# Patient Record
Sex: Male | Born: 1955 | Race: Black or African American | Hispanic: No | Marital: Married | Smoking: Current every day smoker
Health system: Southern US, Community
[De-identification: ages and names within clinical notes are randomized; demographics above are authoritative.]

## PROBLEM LIST (undated history)

## (undated) DIAGNOSIS — C801 Malignant (primary) neoplasm, unspecified: Secondary | ICD-10-CM

## (undated) DIAGNOSIS — E7800 Pure hypercholesterolemia, unspecified: Secondary | ICD-10-CM

## (undated) DIAGNOSIS — I509 Heart failure, unspecified: Secondary | ICD-10-CM

## (undated) DIAGNOSIS — I10 Essential (primary) hypertension: Secondary | ICD-10-CM

## (undated) DIAGNOSIS — E119 Type 2 diabetes mellitus without complications: Secondary | ICD-10-CM

## (undated) DIAGNOSIS — G629 Polyneuropathy, unspecified: Secondary | ICD-10-CM

## (undated) DIAGNOSIS — J449 Chronic obstructive pulmonary disease, unspecified: Secondary | ICD-10-CM

---

## 2015-08-30 ENCOUNTER — Inpatient Hospital Stay (HOSPITAL_COMMUNITY)
Admission: EM | Admit: 2015-08-30 | Discharge: 2015-09-05 | DRG: 291 | Disposition: A | Discharge status: Home / Self Care | Payer: Medicare Other | Attending: Internal Medicine | Admitting: Internal Medicine

## 2015-08-30 ENCOUNTER — Emergency Department (HOSPITAL_COMMUNITY): Payer: Medicare Other

## 2015-08-30 ENCOUNTER — Encounter (HOSPITAL_COMMUNITY): Payer: Self-pay | Admitting: Emergency Medicine

## 2015-08-30 DIAGNOSIS — F17200 Nicotine dependence, unspecified, uncomplicated: Secondary | ICD-10-CM | POA: Diagnosis present

## 2015-08-30 DIAGNOSIS — Z7984 Long term (current) use of oral hypoglycemic drugs: Secondary | ICD-10-CM

## 2015-08-30 DIAGNOSIS — E876 Hypokalemia: Secondary | ICD-10-CM

## 2015-08-30 DIAGNOSIS — D649 Anemia, unspecified: Secondary | ICD-10-CM | POA: Diagnosis present

## 2015-08-30 DIAGNOSIS — R0602 Shortness of breath: Secondary | ICD-10-CM | POA: Diagnosis not present

## 2015-08-30 DIAGNOSIS — I110 Hypertensive heart disease with heart failure: Secondary | ICD-10-CM | POA: Diagnosis present

## 2015-08-30 DIAGNOSIS — J9601 Acute respiratory failure with hypoxia: Secondary | ICD-10-CM | POA: Diagnosis present

## 2015-08-30 DIAGNOSIS — C229 Malignant neoplasm of liver, not specified as primary or secondary: Secondary | ICD-10-CM

## 2015-08-30 DIAGNOSIS — E7800 Pure hypercholesterolemia, unspecified: Secondary | ICD-10-CM | POA: Diagnosis present

## 2015-08-30 DIAGNOSIS — J449 Chronic obstructive pulmonary disease, unspecified: Secondary | ICD-10-CM | POA: Diagnosis present

## 2015-08-30 DIAGNOSIS — Z23 Encounter for immunization: Secondary | ICD-10-CM

## 2015-08-30 DIAGNOSIS — I5041 Acute combined systolic (congestive) and diastolic (congestive) heart failure: Secondary | ICD-10-CM | POA: Diagnosis not present

## 2015-08-30 DIAGNOSIS — I10 Essential (primary) hypertension: Secondary | ICD-10-CM | POA: Diagnosis not present

## 2015-08-30 DIAGNOSIS — E785 Hyperlipidemia, unspecified: Secondary | ICD-10-CM | POA: Diagnosis not present

## 2015-08-30 DIAGNOSIS — I509 Heart failure, unspecified: Secondary | ICD-10-CM

## 2015-08-30 DIAGNOSIS — C227 Other specified carcinomas of liver: Secondary | ICD-10-CM | POA: Diagnosis not present

## 2015-08-30 DIAGNOSIS — I5021 Acute systolic (congestive) heart failure: Secondary | ICD-10-CM | POA: Diagnosis not present

## 2015-08-30 DIAGNOSIS — R0902 Hypoxemia: Secondary | ICD-10-CM | POA: Diagnosis not present

## 2015-08-30 DIAGNOSIS — E119 Type 2 diabetes mellitus without complications: Secondary | ICD-10-CM | POA: Diagnosis present

## 2015-08-30 HISTORY — DX: Type 2 diabetes mellitus without complications: E11.9

## 2015-08-30 HISTORY — DX: Essential (primary) hypertension: I10

## 2015-08-30 HISTORY — DX: Malignant (primary) neoplasm, unspecified: C80.1

## 2015-08-30 HISTORY — DX: Heart failure, unspecified: I50.9

## 2015-08-30 HISTORY — DX: Pure hypercholesterolemia, unspecified: E78.00

## 2015-08-30 HISTORY — DX: Polyneuropathy, unspecified: G62.9

## 2015-08-30 HISTORY — DX: Chronic obstructive pulmonary disease, unspecified: J44.9

## 2015-08-30 LAB — BRAIN NATRIURETIC PEPTIDE: B NATRIURETIC PEPTIDE 5: 946 pg/mL — AB (ref 0.0–100.0)

## 2015-08-30 LAB — BASIC METABOLIC PANEL
ANION GAP: 7 (ref 5–15)
BUN: 14 mg/dL (ref 6–20)
CALCIUM: 8.4 mg/dL — AB (ref 8.9–10.3)
CO2: 26 mmol/L (ref 22–32)
CREATININE: 1.08 mg/dL (ref 0.61–1.24)
Chloride: 106 mmol/L (ref 101–111)
Glucose, Bld: 138 mg/dL — ABNORMAL HIGH (ref 65–99)
Potassium: 2.5 mmol/L — CL (ref 3.5–5.1)
SODIUM: 139 mmol/L (ref 135–145)

## 2015-08-30 LAB — HEPATIC FUNCTION PANEL
ALT: 30 U/L (ref 17–63)
AST: 44 U/L — ABNORMAL HIGH (ref 15–41)
Albumin: 3.4 g/dL — ABNORMAL LOW (ref 3.5–5.0)
Alkaline Phosphatase: 156 U/L — ABNORMAL HIGH (ref 38–126)
BILIRUBIN DIRECT: 0.3 mg/dL (ref 0.1–0.5)
BILIRUBIN INDIRECT: 0.7 mg/dL (ref 0.3–0.9)
TOTAL PROTEIN: 8 g/dL (ref 6.5–8.1)
Total Bilirubin: 1 mg/dL (ref 0.3–1.2)

## 2015-08-30 LAB — CBC
HCT: 26.7 % — ABNORMAL LOW (ref 39.0–52.0)
HEMOGLOBIN: 7.3 g/dL — AB (ref 13.0–17.0)
MCH: 19.7 pg — ABNORMAL LOW (ref 26.0–34.0)
MCHC: 27.3 g/dL — ABNORMAL LOW (ref 30.0–36.0)
MCV: 72.2 fL — ABNORMAL LOW (ref 78.0–100.0)
PLATELETS: 235 10*3/uL (ref 150–400)
RBC: 3.7 MIL/uL — AB (ref 4.22–5.81)
RDW: 23.6 % — ABNORMAL HIGH (ref 11.5–15.5)
WBC: 7.8 10*3/uL (ref 4.0–10.5)

## 2015-08-30 LAB — TROPONIN I
TROPONIN I: 0.03 ng/mL — AB (ref <0.03)
TROPONIN I: 0.03 ng/mL — AB (ref <0.03)

## 2015-08-30 LAB — PROTIME-INR
INR: 1.11 (ref 0.00–1.49)
Prothrombin Time: 14.4 seconds (ref 11.6–15.2)

## 2015-08-30 LAB — GLUCOSE, CAPILLARY
GLUCOSE-CAPILLARY: 212 mg/dL — AB (ref 65–99)
GLUCOSE-CAPILLARY: 273 mg/dL — AB (ref 65–99)

## 2015-08-30 MED ORDER — KETOROLAC TROMETHAMINE 15 MG/ML IJ SOLN
15.0000 mg | Freq: Four times a day (QID) | INTRAMUSCULAR | Status: AC | PRN
  Administered 2015-08-30: 15 mg via INTRAVENOUS
  Filled 2015-08-30: qty 1

## 2015-08-30 MED ORDER — IOPAMIDOL (ISOVUE-370) INJECTION 76%
100.0000 mL | Freq: Once | INTRAVENOUS | Status: AC | PRN
  Administered 2015-08-30: 100 mL via INTRAVENOUS

## 2015-08-30 MED ORDER — METHYLPREDNISOLONE SODIUM SUCC 125 MG IJ SOLR
125.0000 mg | Freq: Once | INTRAMUSCULAR | Status: AC
  Administered 2015-08-30: 125 mg via INTRAVENOUS
  Filled 2015-08-30: qty 2

## 2015-08-30 MED ORDER — IPRATROPIUM-ALBUTEROL 0.5-2.5 (3) MG/3ML IN SOLN: 3.0000 mL | RESPIRATORY_TRACT | Status: DC | PRN

## 2015-08-30 MED ORDER — NICOTINE 14 MG/24HR TD PT24
14.0000 mg | MEDICATED_PATCH | Freq: Every day | TRANSDERMAL | Status: DC
  Administered 2015-08-30 – 2015-09-05 (×7): 14 mg via TRANSDERMAL
  Filled 2015-08-30 (×7): qty 1

## 2015-08-30 MED ORDER — LISINOPRIL 5 MG PO TABS
5.0000 mg | ORAL_TABLET | Freq: Every day | ORAL | Status: DC
  Administered 2015-08-30 – 2015-09-05 (×7): 5 mg via ORAL
  Filled 2015-08-30 (×7): qty 1

## 2015-08-30 MED ORDER — POTASSIUM CHLORIDE 10 MEQ/100ML IV SOLN
10.0000 meq | INTRAVENOUS | Status: AC
  Administered 2015-08-30 (×2): 10 meq via INTRAVENOUS
  Filled 2015-08-30 (×2): qty 100

## 2015-08-30 MED ORDER — INSULIN ASPART 100 UNIT/ML ~~LOC~~ SOLN
0.0000 [IU] | Freq: Three times a day (TID) | SUBCUTANEOUS | Status: DC
  Administered 2015-08-30: 5 [IU] via SUBCUTANEOUS
  Administered 2015-08-31: 2 [IU] via SUBCUTANEOUS
  Administered 2015-08-31: 5 [IU] via SUBCUTANEOUS
  Administered 2015-09-01 (×3): 2 [IU] via SUBCUTANEOUS
  Administered 2015-09-02: 3 [IU] via SUBCUTANEOUS
  Administered 2015-09-02 – 2015-09-03 (×2): 2 [IU] via SUBCUTANEOUS
  Administered 2015-09-03: 3 [IU] via SUBCUTANEOUS
  Administered 2015-09-04: 2 [IU] via SUBCUTANEOUS
  Administered 2015-09-05: 3 [IU] via SUBCUTANEOUS

## 2015-08-30 MED ORDER — HEPARIN SODIUM (PORCINE) 5000 UNIT/ML IJ SOLN
5000.0000 [IU] | Freq: Three times a day (TID) | INTRAMUSCULAR | Status: DC
Start: 2015-08-30 — End: 2015-09-05
  Administered 2015-08-30 – 2015-09-02 (×10): 5000 [IU] via SUBCUTANEOUS
  Filled 2015-08-30 (×16): qty 1

## 2015-08-30 MED ORDER — ZOLPIDEM TARTRATE 5 MG PO TABS
5.0000 mg | ORAL_TABLET | Freq: Every evening | ORAL | Status: DC | PRN
  Administered 2015-08-30 – 2015-09-04 (×6): 5 mg via ORAL
  Filled 2015-08-30 (×6): qty 1

## 2015-08-30 MED ORDER — GABAPENTIN 400 MG PO CAPS
400.0000 mg | ORAL_CAPSULE | Freq: Three times a day (TID) | ORAL | Status: DC
  Administered 2015-08-30 – 2015-09-05 (×17): 400 mg via ORAL
  Filled 2015-08-30 (×18): qty 1

## 2015-08-30 MED ORDER — IPRATROPIUM-ALBUTEROL 0.5-2.5 (3) MG/3ML IN SOLN
RESPIRATORY_TRACT | Status: AC
  Filled 2015-08-30: qty 3

## 2015-08-30 MED ORDER — POTASSIUM CHLORIDE CRYS ER 20 MEQ PO TBCR
40.0000 meq | EXTENDED_RELEASE_TABLET | Freq: Two times a day (BID) | ORAL | Status: AC
  Administered 2015-08-30 (×2): 40 meq via ORAL
  Filled 2015-08-30 (×2): qty 2

## 2015-08-30 MED ORDER — IPRATROPIUM-ALBUTEROL 0.5-2.5 (3) MG/3ML IN SOLN
3.0000 mL | Freq: Once | RESPIRATORY_TRACT | Status: AC
  Administered 2015-08-30: 3 mL via RESPIRATORY_TRACT

## 2015-08-30 MED ORDER — ALBUTEROL SULFATE (2.5 MG/3ML) 0.083% IN NEBU: 5.0000 mg | INHALATION_SOLUTION | Freq: Once | RESPIRATORY_TRACT | Status: DC

## 2015-08-30 MED ORDER — FUROSEMIDE 10 MG/ML IJ SOLN
40.0000 mg | Freq: Once | INTRAMUSCULAR | Status: AC
  Administered 2015-08-30: 40 mg via INTRAVENOUS
  Filled 2015-08-30: qty 4

## 2015-08-30 MED ORDER — PNEUMOCOCCAL VAC POLYVALENT 25 MCG/0.5ML IJ INJ
0.5000 mL | INJECTION | INTRAMUSCULAR | Status: AC
  Administered 2015-08-31: 0.5 mL via INTRAMUSCULAR
  Filled 2015-08-30: qty 0.5

## 2015-08-30 MED ORDER — ALBUTEROL SULFATE (2.5 MG/3ML) 0.083% IN NEBU
INHALATION_SOLUTION | RESPIRATORY_TRACT | Status: AC
  Filled 2015-08-30: qty 3

## 2015-08-30 MED ORDER — SERTRALINE HCL 50 MG PO TABS
200.0000 mg | ORAL_TABLET | Freq: Every day | ORAL | Status: DC
  Administered 2015-08-30 – 2015-09-05 (×7): 200 mg via ORAL
  Filled 2015-08-30 (×7): qty 4

## 2015-08-30 MED ORDER — FUROSEMIDE 10 MG/ML IJ SOLN
40.0000 mg | Freq: Two times a day (BID) | INTRAMUSCULAR | Status: DC
Start: 2015-08-30 — End: 2015-09-05
  Administered 2015-08-30 – 2015-09-05 (×12): 40 mg via INTRAVENOUS
  Filled 2015-08-30 (×12): qty 4

## 2015-08-30 MED ORDER — CETYLPYRIDINIUM CHLORIDE 0.05 % MT LIQD
7.0000 mL | Freq: Two times a day (BID) | OROMUCOSAL | Status: DC
  Administered 2015-08-30 – 2015-09-05 (×11): 7 mL via OROMUCOSAL

## 2015-08-30 MED ORDER — IPRATROPIUM BROMIDE 0.02 % IN SOLN: 0.5000 mg | Freq: Once | RESPIRATORY_TRACT | Status: DC

## 2015-08-30 MED ORDER — POTASSIUM CHLORIDE CRYS ER 20 MEQ PO TBCR
40.0000 meq | EXTENDED_RELEASE_TABLET | Freq: Once | ORAL | Status: AC
  Administered 2015-08-30: 40 meq via ORAL
  Filled 2015-08-30: qty 2

## 2015-08-30 MED ORDER — BUPROPION HCL ER (XL) 150 MG PO TB24
150.0000 mg | ORAL_TABLET | Freq: Every day | ORAL | Status: DC
  Administered 2015-08-30 – 2015-09-05 (×7): 150 mg via ORAL
  Filled 2015-08-30 (×10): qty 1

## 2015-08-30 MED ORDER — LURASIDONE HCL 40 MG PO TABS
20.0000 mg | ORAL_TABLET | Freq: Every day | ORAL | Status: DC
  Administered 2015-08-30 – 2015-09-05 (×7): 20 mg via ORAL
  Filled 2015-08-30 (×9): qty 1

## 2015-08-30 MED ORDER — SODIUM CHLORIDE 0.9% FLUSH
3.0000 mL | Freq: Two times a day (BID) | INTRAVENOUS | Status: DC
  Administered 2015-08-30 – 2015-09-05 (×10): 3 mL via INTRAVENOUS

## 2015-08-30 MED ORDER — ALBUTEROL SULFATE (2.5 MG/3ML) 0.083% IN NEBU
2.5000 mg | INHALATION_SOLUTION | Freq: Once | RESPIRATORY_TRACT | Status: AC
Start: 2015-08-30 — End: 2015-08-30
  Administered 2015-08-30: 2.5 mg via RESPIRATORY_TRACT

## 2015-08-30 NOTE — ED Notes (Signed)
EDP at bedside  

## 2015-08-30 NOTE — ED Notes (Addendum)
Pt reports increased shortness of breath since Tuesday. Pt reports was diagnosed with liver cancer on Tuesday. Pt denies any known fevers but reports intermittent productive cough. Pt 72% on room air. Pt placed on 2liters, oxygen saturation 90%. Oxygen titrated to 4liters, pt currently at 92%. Pt denies any pain.

## 2015-08-30 NOTE — ED Notes (Signed)
Hospitalist at bedside 

## 2015-08-30 NOTE — ED Notes (Signed)
CRITICAL VALUE ALERT  Critical value received:  Potassium 2.5, Troponin 0.03  Date of notification:  08/30/2015  Time of notification: 1100  Critical value read back:: Yes  Nurse who received alert:  Beverly Gust   MD notified (1st page):  Dr. Eulis Foster

## 2015-08-30 NOTE — ED Provider Notes (Signed)
CSN: [id number]     Arrival date & time 08/30/15  [id number] History  By signing my name below, I, Ruben Stanley, attest that this documentation has been prepared under the direction and in the presence of Daleen Bo, MD. Electronically Signed: Eustaquio Stanley, ED Scribe. 08/30/2015. 10:43 AM.   Chief Complaint  Patient presents with  . Shortness of Breath   The history is provided by the patient and the spouse. No language interpreter was used.    HPI Comments: Ruben Stanley is a 60 y.o. male with PMHx liver cancer, COPD, DM, HTN, HLD, and CHF who presents to the Emergency Department complaining of gradual onset, constant, shortness of breath x 1 week, worsening 1 day ago. Pt reports that the shortness of breath is exacerbated with exertion and laying flat. Pt also complains of a productive cough with clear phlegm. Wife reports that pt also had bilateral foot swelling. He typically does breathing treatments but ran out 5 days ago and has not had a treatment since. Pt was seen at [facility] 2 days ago and diagnosed with liver cancer. He has a follow-up appointment with the [facility] gastroenterologist, on 09/04/2015. He was having shortness of breath at the time but wife reports it was much worse yesterday after returning home. Pt has been eating less than normal. Denies hemoptysis, fever, dizziness, chest pain, abdominal pain, or any other associated symptoms. Pt is current everyday smoker. No history of cardiac disorders. There are no other known modifying factors.  Past Medical History  Diagnosis Date  . Cancer [facility])     liver cancer  . COPD (chronic obstructive pulmonary disease) ([place])   . Diabetes mellitus without complication ([place])   . Hypertension   . High cholesterol   . Neuropathy ([place])   . CHF (congestive heart failure) ([place])    History reviewed. No pertinent past surgical history. History reviewed. No pertinent family history. Social History  Substance Use Topics  . Smoking status:  Current Every Day Smoker -- 1.00 packs/day  . Smokeless tobacco: None  . Alcohol Use: None    Review of Systems  Constitutional: Negative for fever.  Respiratory: Positive for cough and shortness of breath.   Cardiovascular: Positive for leg swelling. Negative for chest pain.  Gastrointestinal: Negative for abdominal pain.  Neurological: Negative for dizziness.   Allergies  Review of patient's allergies indicates not on file.  Home Medications   Prior to Admission medications   Medication Sig Start Date End Date Taking? Authorizing Provider  acetaminophen (TYLENOL) 500 MG tablet Take 500 mg by mouth 3 (three) times daily.   Yes Historical Provider, MD  buPROPion (WELLBUTRIN XL) 150 MG 24 hr tablet Take 150 mg by mouth daily.   Yes Historical Provider, MD  cholecalciferol (VITAMIN D) 1000 units tablet Take 1,000 Units by mouth daily.   Yes Historical Provider, MD  gabapentin (NEURONTIN) 400 MG capsule Take 400 mg by mouth every 8 (eight) hours.   Yes Historical Provider, MD  lurasidone (LATUDA) 20 MG TABS tablet Take 20 mg by mouth daily.   Yes Historical Provider, MD  metFORMIN (GLUCOPHAGE) 500 MG tablet Take 500 mg by mouth 2 (two) times daily with a meal.   Yes Historical Provider, MD  sertraline (ZOLOFT) 100 MG tablet Take 200 mg by mouth daily.   Yes Historical Provider, MD   BP 159/105 mmHg  Pulse 103  Temp(Src) 97.9 F (36.6 C) (Oral)  Resp 20  Ht 6\' 1"  (1.854 m)  Wt 274 lb (  124.286 kg)  BMI 36.16 kg/m2  SpO2 91% Physical Exam  Constitutional: He is oriented to person, place, and time. He appears well-developed and well-nourished.  HENT:  Head: Normocephalic and atraumatic.  Right Ear: External ear normal.  Left Ear: External ear normal.  Eyes: Conjunctivae and EOM are normal. Pupils are equal, round, and reactive to light.  Neck: Normal range of motion and phonation normal. Neck supple.  Cardiovascular: Normal rate, regular rhythm and normal heart sounds.    Pulmonary/Chest: Effort normal. He has no wheezes. He has no rhonchi. He has no rales. He exhibits no bony tenderness.  Decreased air movement on expiration bilaterally  Abdominal: Soft. There is no tenderness.  Musculoskeletal: Normal range of motion. He exhibits edema.  2+ peripheral edema bilateral lower legs  Neurological: He is alert and oriented to person, place, and time. No cranial nerve deficit or sensory deficit. He exhibits normal muscle tone. Coordination normal.  Skin: Skin is warm, dry and intact.  Psychiatric: He has a normal mood and affect. His behavior is normal. Judgment and thought content normal.  Nursing note and vitals reviewed.   ED Course  Procedures (including critical care time)  DIAGNOSTIC STUDIES: Oxygen Saturation is 93% on RA, adequate by my interpretation.    Medications  potassium chloride 10 mEq in 100 mL IVPB (not administered)  methylPREDNISolone sodium succinate (SOLU-MEDROL) 125 mg/2 mL injection 125 mg (125 mg Intravenous Given 08/30/15 1041)  albuterol (PROVENTIL) (2.5 MG/3ML) 0.083% nebulizer solution 2.5 mg (2.5 mg Nebulization Given 08/30/15 1045)  ipratropium-albuterol (DUONEB) 0.5-2.5 (3) MG/3ML nebulizer solution 3 mL (3 mLs Nebulization Given 08/30/15 1045)  potassium chloride SA (K-DUR,KLOR-CON) CR tablet 40 mEq (40 mEq Oral Given 08/30/15 1106)  furosemide (LASIX) injection 40 mg (40 mg Intravenous Given 08/30/15 1205)  iopamidol (ISOVUE-370) 76 % injection 100 mL (100 mLs Intravenous Contrast Given 08/30/15 1224)    Patient Vitals for the past 24 hrs:  BP Temp Temp src Pulse Resp SpO2 Height Weight  08/30/15 1300 (!) 159/105 mmHg - - 103 - 91 % - -  08/30/15 1245 - - - 101 - 90 % - -  08/30/15 1200 146/87 mmHg - - 100 20 91 % - -  08/30/15 1130 - - - 103 - 93 % - -  08/30/15 1120 - - - - - (!) 88 % - -  08/30/15 1100 148/96 mmHg - - 99 18 95 % - -  08/30/15 1046 - - - - - 93 % - -  08/30/15 1030 150/91 mmHg - - 97 16 93 % - -   08/30/15 1015 - - - 99 17 93 % - -  08/30/15 1009 - - - 102 14 91 % - -  08/30/15 1004 157/95 mmHg 97.9 F (36.6 C) Oral 102 20 92 % 6\' 1"  (1.854 m) 274 lb (124.286 kg)  08/30/15 1002 157/95 mmHg - - - - - - -   CT angiogram, chest, for rule out PE, because of symptoms out of proportion to findings.   Marked hypoxia (72% on room air) improved with nasal cannula oxygen, to greater than 90%.  COORDINATION OF CARE: 10:39 AM-Discussed treatment plan which includes consulting [facility] with pt at bedside and pt agreed to plan.    1:22 PM Reevaluation with update and discussion. After initial assessment and treatment, an updated evaluation reveals he has diuresed well after Lasix. Patient and family members updated on findings. Stony Stegmann L   1:25 PM-Consult complete with Hospitalist. Patient  case explained and discussed. He agrees to admit patient for further evaluation and treatment. Call ended at 13:45   CRITICAL CARE Performed by: Richarda Blade Total critical care time: 35 minutes Critical care time was exclusive of separately billable procedures and treating other patients. Critical care was necessary to treat or prevent imminent or life-threatening deterioration. Critical care was time spent personally by me on the following activities: development of treatment plan with patient and/or surrogate as well as nursing, discussions with consultants, evaluation of patient's response to treatment, examination of patient, obtaining history from patient or surrogate, ordering and performing treatments and interventions, ordering and review of laboratory studies, ordering and review of radiographic studies, pulse oximetry and re-evaluation of patient's condition.  Labs Review Labs Reviewed  BASIC METABOLIC PANEL - Abnormal; Notable for the following:    Potassium 2.5 (*)    Glucose, Bld 138 (*)    Calcium 8.4 (*)    All other components within normal limits  CBC - Abnormal; Notable for  the following:    RBC 3.70 (*)    Hemoglobin 7.3 (*)    HCT 26.7 (*)    MCV 72.2 (*)    MCH 19.7 (*)    MCHC 27.3 (*)    RDW 23.6 (*)    All other components within normal limits  TROPONIN I - Abnormal; Notable for the following:    Troponin I 0.03 (*)    All other components within normal limits  BRAIN NATRIURETIC PEPTIDE - Abnormal; Notable for the following:    B Natriuretic Peptide 946.0 (*)    All other components within normal limits  HEPATIC FUNCTION PANEL - Abnormal; Notable for the following:    Albumin 3.4 (*)    AST 44 (*)    Alkaline Phosphatase 156 (*)    All other components within normal limits  PROTIME-INR  TROPONIN I    Imaging Review Dg Chest 2 View  08/30/2015  CLINICAL DATA:  Shortness of breath and hypoxia a, worse at night. Smoking history. EXAM: CHEST  2 VIEW COMPARISON:  None. FINDINGS: The heart is enlarged. There are bilateral pleural effusions layering dependently with dependent atelectasis. There is interstitial pulmonary edema and early alveolar edema. No acute bone finding. IMPRESSION: Congestive heart failure with pulmonary edema and bilateral pleural effusions. Electronically Signed   By: Nelson Chimes M.D.   On: 08/30/2015 11:43   Ct Angio Chest Pe W/cm &/or Wo Cm  08/30/2015  CLINICAL DATA:  Shortness of breath, worsening over the last 2 days. Recent diagnosis of liver cancer. EXAM: CT ANGIOGRAPHY CHEST WITH CONTRAST TECHNIQUE: Multidetector CT imaging of the chest was performed using the standard protocol during bolus administration of intravenous contrast. Multiplanar CT image reconstructions and MIPs were obtained to evaluate the vascular anatomy. CONTRAST:  100 cc Isovue 370 COMPARISON:  Chest radiography same day FINDINGS: Pulmonary arterial opacification is moderate.  No visible emboli. There are bilateral effusions layering dependently. There is dependent pulmonary atelectasis with considerable volume loss in both lower lobes. There is interstitial  prominence suggesting edema. No mediastinal mass or lymphadenopathy. Aortic atherosclerosis, mild. No visible coronary artery calcification. Scans in the upper abdomen show cirrhosis of the liver. No definable focal lesion using this technique. The patient has a few calcified gallstones. Review of the MIP images confirms the above findings. IMPRESSION: Most consistent with congestive heart failure. Bilateral effusions with dependent atelectasis. Interstitial edema. No pulmonary emboli. Cirrhosis of the liver.  Few gallstones. Electronically Signed   By:  Nelson Chimes M.D.   On: 08/30/2015 12:47   I have personally reviewed and evaluated these images and lab results as part of my medical decision-making.   EKG Interpretation   Date/Time:  Thursday August 30 2015 10:05:19 EDT Ventricular Rate:  104 PR Interval:    QRS Duration: 96 QT Interval:  398 QTC Calculation: 524 R Axis:   49 Text Interpretation:  Sinus tachycardia Nonspecific T abnormalities,  lateral leads Prolonged QT interval No old tracing to compare Reconfirmed  by [facility]  MD, Asa Fath [phone]) on 08/30/2015 10:48:54 AM      MDM   Final diagnoses:  Acute congestive heart failure, unspecified congestive heart failure type (HCC)  Hypoxia  Malignant neoplasm of liver, unspecified liver malignancy type ([place])  Hypokalemia   Acute  congestive heart failure, cause not clear. No history of failure. I do not think that this represents an acute oncologic crisis. Patient will require admission for further treatment and assessment, to include cardiac echo. Doubt ACS.  Nursing Notes Reviewed/ Care Coordinated, and agree without changes. Applicable Imaging Reviewed.  Interpretation of Laboratory Data incorporated into ED treatment  Plan: Admit  I personally performed the services described in this documentation, which was scribed in my presence. The recorded information has been reviewed and is accurate.      Daleen Bo, MD 08/30/15  1351

## 2015-08-30 NOTE — H&P (Signed)
History and Physical    Ruben Stanley [id number] DOB: [birth year] DOA: 08/30/2015  PCP: No primary care provider on file. [facility]  Patient coming from: Home  Chief Complaint: Course of breath  HPI: Ruben Stanley is a 60 y.o. male with medical history significant of tobacco abuse, COPD, diabetes, hypertension, recently diagnosed liver cancer followed by the [place], who presents to the emergency department with complaints of worsening shortness of breath over the past week. Patient does acknowledge symptoms of orthopnea. As patient's breathing worsened, he subsequently presented to the emergency department.  ED Course: In emergency department, patient was noted have an elevated BNP of under 1000. Chest x-ray was notable for findings of volume overload. In addition, patient was noted to be hypoxic on room air, improving with O2 via nasal cannula. Patient was given 1 dose of Solu-Medrol, neb treatment, one dose of IV Lasix with good urine output. Hospital service consulted for consideration for mission given concerns of new heart failure.  Review of Systems:  Review of Systems  Constitutional: Negative for fever and chills.  HENT: Negative for ear pain and tinnitus.   Eyes: Negative for double vision and pain.  Respiratory: Positive for shortness of breath. Negative for sputum production.   Cardiovascular: Positive for orthopnea. Negative for chest pain.  Gastrointestinal: Negative for vomiting and abdominal pain.  Genitourinary: Negative for frequency and hematuria.  Musculoskeletal: Negative for falls and neck pain.  Neurological: Negative for tremors and loss of consciousness.  Psychiatric/Behavioral: Negative for hallucinations and memory loss.     Past Medical History  Diagnosis Date  . Cancer [facility])     liver cancer  . COPD (chronic obstructive pulmonary disease) ([place])   . Diabetes mellitus without complication ([place])   . Hypertension   . High cholesterol   . Neuropathy  ([place])   . CHF (congestive heart failure) ([place])     History reviewed. No pertinent past surgical history.   reports that he has been smoking.  He does not have any smokeless tobacco history on file. He reports that he does not use illicit drugs. His alcohol history is not on file.  Not on File  History reviewed. No pertinent family history.  Prior to Admission medications   Medication Sig Start Date End Date Taking? Authorizing Provider  acetaminophen (TYLENOL) 500 MG tablet Take 500 mg by mouth 3 (three) times daily.   Yes Historical Provider, MD  buPROPion (WELLBUTRIN XL) 150 MG 24 hr tablet Take 150 mg by mouth daily.   Yes Historical Provider, MD  cholecalciferol (VITAMIN D) 1000 units tablet Take 1,000 Units by mouth daily.   Yes Historical Provider, MD  gabapentin (NEURONTIN) 400 MG capsule Take 400 mg by mouth every 8 (eight) hours.   Yes Historical Provider, MD  lurasidone (LATUDA) 20 MG TABS tablet Take 20 mg by mouth daily.   Yes Historical Provider, MD  metFORMIN (GLUCOPHAGE) 500 MG tablet Take 500 mg by mouth 2 (two) times daily with a meal.   Yes Historical Provider, MD  sertraline (ZOLOFT) 100 MG tablet Take 200 mg by mouth daily.   Yes Historical Provider, MD    Physical Exam: Filed Vitals:   08/30/15 1130 08/30/15 1200 08/30/15 1245 08/30/15 1300  BP:  146/87  159/105  Pulse: 103 100 101 103  Temp:      TempSrc:      Resp:  20    Height:      Weight:      SpO2: 93% 91% 90%  91%      Constitutional: NAD, calm, comfortable Filed Vitals:   08/30/15 1130 08/30/15 1200 08/30/15 1245 08/30/15 1300  BP:  146/87  159/105  Pulse: 103 100 101 103  Temp:      TempSrc:      Resp:  20    Height:      Weight:      SpO2: 93% 91% 90% 91%   Eyes: PERRL, lids and conjunctivae normal  ENMT: Mucous membranes are moist. Posterior pharynx clear of any exudate or lesions.Normal dentition.  Neck: normal, supple, no masses, no thyromegaly Respiratory: Scattered crackles  bilaterally, no active wheezing, mildly increased respiratory effort Cardiovascular: Regular rate and rhythm,  Abdomen: no tenderness, no masses palpated. No hepatosplenomegaly. Bowel sounds positive.  Musculoskeletal: no clubbing / cyanosis. No joint deformity upper and lower extremities. 2+ pitting edema bilateral lower extremities.  Skin: no rashes, lesions, ulcers. No induration Neurologic: CN 2-12 grossly intact. Sensation intact, DTR normal. Strength 5/5 in all 4.  Psychiatric: Normal judgment and insight. Alert and oriented x 3. Normal mood.    Labs on Admission: I have personally reviewed following labs and imaging studies  CBC:  Recent Labs Lab 08/30/15 1019  WBC 7.8  HGB 7.3*  HCT 26.7*  MCV 72.2*  PLT [id number]   Basic Metabolic Panel:  Recent Labs Lab 08/30/15 1019  NA 139  K 2.5*  CL 106  CO2 26  GLUCOSE 138*  BUN 14  CREATININE 1.08  CALCIUM 8.4*   GFR: Estimated Creatinine Clearance: 101.8 mL/min (by C-G formula based on Cr of 1.08). Liver Function Tests:  Recent Labs Lab 08/30/15 1019  AST 44*  ALT 30  ALKPHOS 156*  BILITOT 1.0  PROT 8.0  ALBUMIN 3.4*   No results for input(s): LIPASE, AMYLASE in the last 168 hours. No results for input(s): AMMONIA in the last 168 hours. Coagulation Profile:  Recent Labs Lab 08/30/15 1019  INR 1.11   Cardiac Enzymes:  Recent Labs Lab 08/30/15 1019  TROPONINI 0.03*   BNP (last 3 results) No results for input(s): PROBNP in the last 8760 hours. HbA1C: No results for input(s): HGBA1C in the last 72 hours. CBG: No results for input(s): GLUCAP in the last 168 hours. Lipid Profile: No results for input(s): CHOL, HDL, LDLCALC, TRIG, CHOLHDL, LDLDIRECT in the last 72 hours. Thyroid Function Tests: No results for input(s): TSH, T4TOTAL, FREET4, T3FREE, THYROIDAB in the last 72 hours. Anemia Panel: No results for input(s): VITAMINB12, FOLATE, FERRITIN, TIBC, IRON, RETICCTPCT in the last 72 hours. Urine  analysis: No results found for: COLORURINE, APPEARANCEUR, LABSPEC, PHURINE, GLUCOSEU, HGBUR, BILIRUBINUR, KETONESUR, PROTEINUR, UROBILINOGEN, NITRITE, LEUKOCYTESUR Sepsis Labs: !!!!!!!!!!!!!!!!!!!!!!!!!!!!!!!!!!!!!!!!!!!! @LABRCNTIP ([id number]) )No results found for this or any previous visit (from the past 240 hour(s)).   Radiological Exams on Admission: Dg Chest 2 View  08/30/2015  CLINICAL DATA:  Shortness of breath and hypoxia a, worse at night. Smoking history. EXAM: CHEST  2 VIEW COMPARISON:  None. FINDINGS: The heart is enlarged. There are bilateral pleural effusions layering dependently with dependent atelectasis. There is interstitial pulmonary edema and early alveolar edema. No acute bone finding. IMPRESSION: Congestive heart failure with pulmonary edema and bilateral pleural effusions. Electronically Signed   By: Nelson Chimes M.D.   On: 08/30/2015 11:43   Ct Angio Chest Pe W/cm &/or Wo Cm  08/30/2015  CLINICAL DATA:  Shortness of breath, worsening over the last 2 days. Recent diagnosis of liver cancer. EXAM: CT ANGIOGRAPHY CHEST WITH CONTRAST TECHNIQUE: Multidetector CT  imaging of the chest was performed using the standard protocol during bolus administration of intravenous contrast. Multiplanar CT image reconstructions and MIPs were obtained to evaluate the vascular anatomy. CONTRAST:  100 cc Isovue 370 COMPARISON:  Chest radiography same day FINDINGS: Pulmonary arterial opacification is moderate.  No visible emboli. There are bilateral effusions layering dependently. There is dependent pulmonary atelectasis with considerable volume loss in both lower lobes. There is interstitial prominence suggesting edema. No mediastinal mass or lymphadenopathy. Aortic atherosclerosis, mild. No visible coronary artery calcification. Scans in the upper abdomen show cirrhosis of the liver. No definable focal lesion using this technique. The patient has a few calcified gallstones. Review of  the MIP images confirms the above findings. IMPRESSION: Most consistent with congestive heart failure. Bilateral effusions with dependent atelectasis. Interstitial edema. No pulmonary emboli. Cirrhosis of the liver.  Few gallstones. Electronically Signed   By: Nelson Chimes M.D.   On: 08/30/2015 12:47    Assessment/Plan Principal Problem:   CHF (congestive heart failure) (HCC) Active Problems:   COPD (chronic obstructive pulmonary disease) (HCC)   DM (diabetes mellitus) ([place])   Essential hypertension   Liver cancer (HCC)   HLD (hyperlipidemia)   Acute CHF (congestive heart failure) (HCC)   Congestive heart failure, unknown if systolic or diastolic-new diagnosis -Patient presents with shortness of breath and findings suggestive of volume overload and new congestive heart failure. -Patient with elevated BNP -Patient has already shown some improvement following one dose of IV Lasix emerged department. -We'll continue patient was scheduled twice a day IV Lasix. -We'll add lisinopril given new heart failure -We will strict I's and O's and daily weights -Monitor I's closely -Admit patient to med telemetry  COPD -We'll continue patient with when necessary neb treatments. -No active wheezing at the time of my exam. We'll continue patient with PRN DuoNeb's  Diabetes mellitus -We'll continue patient with supplemental sliding scale insulin  Hypertension -Blood pressure currently stable -Have added ACE inhibitor as per above  Liver cancer -Reportedly recently diagnosed at the [facility] -Patient reportedly has a follow-up appointment soon  Hyperlipidemia -Stable   DVT prophylaxis: Lovenox subcutaneous Code Status: Full Family Communication: Patient in room, family at bedside Disposition Plan: Uncertain Consults called:  Admission status: Admit inpatient, telemetry   CHIU, Orpah Melter MD [facility] Pager [phone]  If 7PM-7AM, please contact  night-coverage [email or web address] Password [email or web address]  08/30/2015, 1:58 PM

## 2015-08-31 ENCOUNTER — Inpatient Hospital Stay (HOSPITAL_COMMUNITY): Payer: Medicare Other

## 2015-08-31 DIAGNOSIS — I110 Hypertensive heart disease with heart failure: Secondary | ICD-10-CM | POA: Diagnosis not present

## 2015-08-31 DIAGNOSIS — I509 Heart failure, unspecified: Secondary | ICD-10-CM

## 2015-08-31 LAB — GLUCOSE, CAPILLARY
GLUCOSE-CAPILLARY: 94 mg/dL (ref 65–99)
GLUCOSE-CAPILLARY: 165 mg/dL — AB (ref 65–99)
Glucose-Capillary: 225 mg/dL — ABNORMAL HIGH (ref 65–99)
Glucose-Capillary: 136 mg/dL — ABNORMAL HIGH (ref 65–99)

## 2015-08-31 LAB — COMPREHENSIVE METABOLIC PANEL
ALK PHOS: 149 U/L — AB (ref 38–126)
ALT: 26 U/L (ref 17–63)
AST: 39 U/L (ref 15–41)
Albumin: 3.2 g/dL — ABNORMAL LOW (ref 3.5–5.0)
BUN: 17 mg/dL (ref 6–20)
CALCIUM: 8.1 mg/dL — AB (ref 8.9–10.3)
CO2: 28 mmol/L (ref 22–32)
Chloride: 109 mmol/L (ref 101–111)
Creatinine, Ser: 1.18 mg/dL (ref 0.61–1.24)
Glucose, Bld: 114 mg/dL — ABNORMAL HIGH (ref 65–99)
POTASSIUM: 3.1 mmol/L — AB (ref 3.5–5.1)
Sodium: 139 mmol/L (ref 135–145)
TOTAL PROTEIN: 7.5 g/dL (ref 6.5–8.1)
Total Bilirubin: 0.7 mg/dL (ref 0.3–1.2)

## 2015-08-31 LAB — CBC
HCT: 25.8 % — ABNORMAL LOW (ref 39.0–52.0)
HEMOGLOBIN: 7 g/dL — AB (ref 13.0–17.0)
MCH: 19.7 pg — ABNORMAL LOW (ref 26.0–34.0)
MCHC: 27.1 g/dL — ABNORMAL LOW (ref 30.0–36.0)
MCV: 72.5 fL — ABNORMAL LOW (ref 78.0–100.0)
Platelets: 245 10*3/uL (ref 150–400)
RBC: 3.56 MIL/uL — AB (ref 4.22–5.81)
RDW: 24 % — ABNORMAL HIGH (ref 11.5–15.5)
WBC: 10.4 10*3/uL (ref 4.0–10.5)

## 2015-08-31 LAB — ECHOCARDIOGRAM COMPLETE
Height: 73 in
WEIGHTICAEL: 4324.8 [oz_av]

## 2015-08-31 LAB — MAGNESIUM: MAGNESIUM: 2.1 mg/dL (ref 1.7–2.4)

## 2015-08-31 MED ORDER — POTASSIUM CHLORIDE CRYS ER 20 MEQ PO TBCR
40.0000 meq | EXTENDED_RELEASE_TABLET | Freq: Two times a day (BID) | ORAL | Status: AC
  Administered 2015-08-31 (×2): 40 meq via ORAL
  Filled 2015-08-31 (×2): qty 2

## 2015-08-31 MED ORDER — PERFLUTREN LIPID MICROSPHERE
1.0000 mL | INTRAVENOUS | Status: AC | PRN
  Administered 2015-08-31: 2 mL via INTRAVENOUS
  Administered 2015-08-31: 1 mL via INTRAVENOUS
  Filled 2015-08-31: qty 10

## 2015-08-31 NOTE — Progress Notes (Signed)
  Echocardiogram 2D Echocardiogram with Definity has been performed.  Ruben Stanley M 08/31/2015, 11:48 AM

## 2015-08-31 NOTE — Care Management Important Message (Signed)
Important Message  Patient Details  Name: Ruben Stanley MRN: [id number] Date of Birth: 1955/10/16   Medicare Important Message Given:  Yes    Pj Zehner, [name], RN 08/31/2015, 2:13 PM

## 2015-08-31 NOTE — Progress Notes (Addendum)
Inpatient Diabetes Program Recommendations  AACE/ADA: New Consensus Statement on Inpatient Glycemic Control (2015)  Target Ranges:  Prepandial:   less than 140 mg/dL      Peak postprandial:   less than 180 mg/dL (1-2 hours)      Critically ill patients:  140 - 180 mg/dL   Results for NEIKO, WIK (MRN [id number]) as of 08/31/2015 10:12  Ref. Range 08/30/2015 16:48 08/30/2015 21:15 08/31/2015 07:40  Glucose-Capillary Latest Ref Range: 65-99 mg/dL 212 (H) 273 (H) 94   Review of Glycemic Control  Diabetes history: DM2 Outpatient Diabetes medications: Metformin 500 mg BID Current orders for Inpatient glycemic control: Novolog 0-15 units TID with meals  Inpatient Diabetes Program Recommendations: Correction (SSI): Please consider ordering Novolog bedtime correction scale. HgbA1C: A1C in process.  Thanks, Barnie Alderman, RN, MSN, CDE Diabetes Coordinator Inpatient Diabetes Program [phone] (Team Pager from [place] to [place]) [phone] ([place] office) [phone] [facility] office) [phone] [facility] office)

## 2015-08-31 NOTE — Progress Notes (Signed)
PROGRESS NOTE    Ruben Stanley  [id number] DOB: 1955-06-17 DOA: 08/30/2015 PCP: No primary care provider on file.    Brief Narrative:  60 y.o. male with medical history significant of tobacco abuse, COPD, diabetes, hypertension, recently diagnosed liver cancer followed by the [place], who presents to the emergency department with complaints of worsening shortness of breath over the past week. Patient does acknowledge symptoms of orthopnea. As patient's breathing worsened, he subsequently presented to the emergency department.  Assessment & Plan:   Principal Problem:   CHF (congestive heart failure) (HCC) Active Problems:   COPD (chronic obstructive pulmonary disease) (HCC)   DM (diabetes mellitus) ([place])   Essential hypertension   Liver cancer (HCC)   HLD (hyperlipidemia)   Acute CHF (congestive heart failure) (HCC)   Acute congestive heart failure, combined systolic and diastolic-chronicity unknown -Patient presents with shortness of breath and findings suggestive of volume overload and new congestive heart failure. -Patient with elevated BNP -We'll continue patient was scheduled twice a day IV Lasix. -We'll add lisinopril given new heart failure -We will continue strict I's and O's and daily weights -As of this morning, patient is net -3.6 L. -Weight on admission 124.2 kg -Current weight 122 kg -Continue Lasix tolerated  COPD -We'll continue patient with when necessary neb treatments. -No active wheezing at the time of my exam. We'll continue patient with PRN DuoNeb's  Diabetes mellitus -We'll continue patient with supplemental sliding scale insulin  Hypertension -Blood pressure currently stable -Have added ACE inhibitor as per above  Liver cancer -Reportedly recently diagnosed at the [facility] -Patient reportedly has a follow-up appointment soon  Hyperlipidemia -Stable   DVT prophylaxis: Heparin subcutaneous Code Status: Full Family Communication: Patient in room,  family at bedside Disposition Plan: Anticipate discharge home when fully diuresed   Consultants:     Procedures:   2-D echocardiogram 08/31/2015: EF 30-35% with grade 2 diastolic dysfunction  Antimicrobials:       Subjective: Reports feeling better today. Still mild short of breath on moderate exertion  Objective: Filed Vitals:   08/30/15 1506 08/30/15 2224 08/31/15 0529 08/31/15 1412  BP: 155/86 150/87 146/86 131/76  Pulse: 100 104 106   Temp: 98.3 F (36.8 C) 98.9 F (37.2 C) 98.9 F (37.2 C) 98 F (36.7 C)  TempSrc: Oral Oral Oral Oral  Resp:  20 20 18   Height: 6\' 1"  (1.854 m)     Weight: 123.106 kg (271 lb 6.4 oz)  122.607 kg (270 lb 4.8 oz)   SpO2: 94% 96% 100% 99%    Intake/Output Summary (Last 24 hours) at 08/31/15 1719 Last data filed at 08/31/15 1412  Gross per 24 hour  Intake    480 ml  Output   1650 ml  Net  -1170 ml   Filed Weights   08/30/15 1004 08/30/15 1506 08/31/15 0529  Weight: 124.286 kg (274 lb) 123.106 kg (271 lb 6.4 oz) 122.607 kg (270 lb 4.8 oz)    Examination:  General exam: Appears calm and comfortable, Sitting in bed  Respiratory system: Clear to auscultation. Respiratory effort normal. Cardiovascular system: S1 & S2 heard, RRR. Gastrointestinal system: Abdomen is nondistended, soft and nontender. No organomegaly or masses felt. Normal bowel sounds heard. Central nervous system: Alert and oriented. No focal neurological deficits. Extremities: Symmetric 5 x 5 power. Skin: No rashes, lesions Psychiatry: Judgement and insight appear normal. Mood & affect appropriate.     Data Reviewed: I have personally reviewed following labs and imaging studies  CBC:  Recent Labs Lab 08/30/15 1019 08/31/15 0545  WBC 7.8 10.4  HGB 7.3* 7.0*  HCT 26.7* 25.8*  MCV 72.2* 72.5*  PLT 235 99991111   Basic Metabolic Panel:  Recent Labs Lab 08/30/15 1019 08/31/15 0545  NA 139 139  K 2.5* 3.1*  CL 106 109  CO2 26 28  GLUCOSE 138* 114*    BUN 14 17  CREATININE 1.08 1.18  CALCIUM 8.4* 8.1*  MG  --  2.1   GFR: Estimated Creatinine Clearance: 92.5 mL/min (by C-G formula based on Cr of 1.18). Liver Function Tests:  Recent Labs Lab 08/30/15 1019 08/31/15 0545  AST 44* 39  ALT 30 26  ALKPHOS 156* 149*  BILITOT 1.0 0.7  PROT 8.0 7.5  ALBUMIN 3.4* 3.2*   No results for input(s): LIPASE, AMYLASE in the last 168 hours. No results for input(s): AMMONIA in the last 168 hours. Coagulation Profile:  Recent Labs Lab 08/30/15 1019  INR 1.11   Cardiac Enzymes:  Recent Labs Lab 08/30/15 1019 08/30/15 1355  TROPONINI 0.03* 0.03*   BNP (last 3 results) No results for input(s): PROBNP in the last 8760 hours. HbA1C: No results for input(s): HGBA1C in the last 72 hours. CBG:  Recent Labs Lab 08/30/15 1648 08/30/15 2115 08/31/15 0740 08/31/15 1118 08/31/15 1605  GLUCAP 212* 273* 94 225* 136*   Lipid Profile: No results for input(s): CHOL, HDL, LDLCALC, TRIG, CHOLHDL, LDLDIRECT in the last 72 hours. Thyroid Function Tests: No results for input(s): TSH, T4TOTAL, FREET4, T3FREE, THYROIDAB in the last 72 hours. Anemia Panel: No results for input(s): VITAMINB12, FOLATE, FERRITIN, TIBC, IRON, RETICCTPCT in the last 72 hours. Sepsis Labs: No results for input(s): PROCALCITON, LATICACIDVEN in the last 168 hours.  No results found for this or any previous visit (from the past 240 hour(s)).       Radiology Studies: Dg Chest 2 View  08/30/2015  CLINICAL DATA:  Shortness of breath and hypoxia a, worse at night. Smoking history. EXAM: CHEST  2 VIEW COMPARISON:  None. FINDINGS: The heart is enlarged. There are bilateral pleural effusions layering dependently with dependent atelectasis. There is interstitial pulmonary edema and early alveolar edema. No acute bone finding. IMPRESSION: Congestive heart failure with pulmonary edema and bilateral pleural effusions. Electronically Signed   By: Nelson Chimes M.D.   On:  08/30/2015 11:43   Ct Angio Chest Pe W/cm &/or Wo Cm  08/30/2015  CLINICAL DATA:  Shortness of breath, worsening over the last 2 days. Recent diagnosis of liver cancer. EXAM: CT ANGIOGRAPHY CHEST WITH CONTRAST TECHNIQUE: Multidetector CT imaging of the chest was performed using the standard protocol during bolus administration of intravenous contrast. Multiplanar CT image reconstructions and MIPs were obtained to evaluate the vascular anatomy. CONTRAST:  100 cc Isovue 370 COMPARISON:  Chest radiography same day FINDINGS: Pulmonary arterial opacification is moderate.  No visible emboli. There are bilateral effusions layering dependently. There is dependent pulmonary atelectasis with considerable volume loss in both lower lobes. There is interstitial prominence suggesting edema. No mediastinal mass or lymphadenopathy. Aortic atherosclerosis, mild. No visible coronary artery calcification. Scans in the upper abdomen show cirrhosis of the liver. No definable focal lesion using this technique. The patient has a few calcified gallstones. Review of the MIP images confirms the above findings. IMPRESSION: Most consistent with congestive heart failure. Bilateral effusions with dependent atelectasis. Interstitial edema. No pulmonary emboli. Cirrhosis of the liver.  Few gallstones. Electronically Signed   By: Nelson Chimes M.D.   On: 08/30/2015  12:47        Scheduled Meds: . antiseptic oral rinse  7 mL Mouth Rinse BID  . buPROPion  150 mg Oral Daily  . furosemide  40 mg Intravenous BID  . gabapentin  400 mg Oral Q8H  . heparin  5,000 Units Subcutaneous Q8H  . insulin aspart  0-15 Units Subcutaneous TID WC  . lisinopril  5 mg Oral Daily  . lurasidone  20 mg Oral Daily  . nicotine  14 mg Transdermal Daily  . potassium chloride  40 mEq Oral BID  . sertraline  200 mg Oral Daily  . sodium chloride flush  3 mL Intravenous Q12H   Continuous Infusions:    LOS: 1 day     CHIU, Orpah Melter, MD [facility] Pager [phone]  If 7PM-7AM, please contact night-coverage [email or web address] Password [facility] 08/31/2015, 5:19 PM

## 2015-08-31 NOTE — Care Management Note (Addendum)
Case Management Note  Patient Details  Name: Ruben Stanley MRN: [id number] Date of Birth: 03-06-1955  Subjective/Objective:   Patient from home, independent with ADL's. New dx of CHF. Reports [facility]               Action/Plan: Anticipate DC home with self care. Will need 02 assessment prior to discharge if not weaned off . Will follow.   Late entry: [facility] called [facility] and is aware [facility]. Will follow patient.  Expected Discharge Date:  09/03/15               Expected Discharge Plan:  Home/Self Care  In-House Referral:  NA  Discharge planning Services  CM Consult  Post Acute Care Choice:  NA Choice offered to:  NA  DME Arranged:    DME Agency:     HH Arranged:    HH Agency:     Status of Service:  In process, will continue to follow  If discussed at Long Length of Stay Meetings, dates discussed:    Additional Comments:   Ruben Stanley, Ruben Reading, RN 08/31/2015, 2:01 PM

## 2015-09-01 DIAGNOSIS — E876 Hypokalemia: Secondary | ICD-10-CM

## 2015-09-01 LAB — BASIC METABOLIC PANEL
ANION GAP: 5 (ref 5–15)
BUN: 19 mg/dL (ref 6–20)
CALCIUM: 8.3 mg/dL — AB (ref 8.9–10.3)
CO2: 31 mmol/L (ref 22–32)
Chloride: 106 mmol/L (ref 101–111)
Creatinine, Ser: 1.21 mg/dL (ref 0.61–1.24)
GFR calc Af Amer: >60 mL/min (ref >60)
Glucose, Bld: 116 mg/dL — ABNORMAL HIGH (ref 65–99)
POTASSIUM: 3.1 mmol/L — AB (ref 3.5–5.1)
SODIUM: 142 mmol/L (ref 135–145)

## 2015-09-01 LAB — HEMOGLOBIN A1C
HEMOGLOBIN A1C: 6 % — AB (ref 4.8–5.6)
Mean Plasma Glucose: 126 mg/dL

## 2015-09-01 LAB — GLUCOSE, CAPILLARY
GLUCOSE-CAPILLARY: 127 mg/dL — AB (ref 65–99)
GLUCOSE-CAPILLARY: 129 mg/dL — AB (ref 65–99)
GLUCOSE-CAPILLARY: 125 mg/dL — AB (ref 65–99)
Glucose-Capillary: 131 mg/dL — ABNORMAL HIGH (ref 65–99)

## 2015-09-01 LAB — CBC
HEMATOCRIT: 27.2 % — AB (ref 39.0–52.0)
HEMOGLOBIN: 7.5 g/dL — AB (ref 13.0–17.0)
MCH: 20.7 pg — ABNORMAL LOW (ref 26.0–34.0)
MCHC: 27.6 g/dL — ABNORMAL LOW (ref 30.0–36.0)
MCV: 74.9 fL — ABNORMAL LOW (ref 78.0–100.0)
Platelets: 233 10*3/uL (ref 150–400)
RBC: 3.63 MIL/uL — ABNORMAL LOW (ref 4.22–5.81)
RDW: 24.8 % — AB (ref 11.5–15.5)
WBC: 9.5 10*3/uL (ref 4.0–10.5)

## 2015-09-01 MED ORDER — POTASSIUM CHLORIDE CRYS ER 20 MEQ PO TBCR
40.0000 meq | EXTENDED_RELEASE_TABLET | Freq: Two times a day (BID) | ORAL | Status: AC
  Administered 2015-09-01 (×2): 40 meq via ORAL
  Filled 2015-09-01 (×2): qty 2

## 2015-09-01 NOTE — Progress Notes (Signed)
PROGRESS NOTE    Ruben Stanley  [id number] DOB: Feb 21, 1955 DOA: 08/30/2015 PCP: No primary care provider on file.    Brief Narrative:  60 y.o. male with medical history significant of tobacco abuse, COPD, diabetes, hypertension, recently diagnosed liver cancer followed by the [place], who presents to the emergency department with complaints of worsening shortness of breath over the past week. Patient does acknowledge symptoms of orthopnea. As patient's breathing worsened, he subsequently presented to the emergency department.  Assessment & Plan:   Principal Problem:   CHF (congestive heart failure) (HCC) Active Problems:   COPD (chronic obstructive pulmonary disease) (HCC)   DM (diabetes mellitus) ([place])   Essential hypertension   Liver cancer (HCC)   HLD (hyperlipidemia)   Acute CHF (congestive heart failure) (HCC)   Hypokalemia   Acute congestive heart failure, combined systolic and diastolic-chronicity unknown -Patient presents with shortness of breath and findings suggestive of volume overload and new congestive heart failure. -Patient with elevated BNP of 946 -Will continue patient was scheduled twice a day IV Lasix. -Added lisinopril given new heart failure -We will continue strict I's and O's and daily weights -As of this morning, patient is net -3.6 L. -Weight on admission 124.2 kg -Current weight 121 kg -Creatinine is up to 1.2 once morning. -We'll continue Lasix as tolerated  Acute hypoxic respiratory failure -Likely secondary to acute congestive heart failure as noted above -Continue to diuresis tolerated. Continue to wean O2 as tolerated  COPD -We'll continue patient with when necessary neb treatments. -No active wheezing at the time of my exam.  -We'll continue patient with PRN DuoNeb's  Diabetes mellitus -We'll continue patient with supplemental sliding scale insulin  Hypertension -Blood pressure currently stable -Patient now on ACE inhibitor as per  above  Liver cancer -Reportedly recently diagnosed at the [facility] -Patient reportedly has a follow-up appointment soon  Hyperlipidemia -Stable  DVT prophylaxis: Heparin subcutaneous Code Status: Full Family Communication: Patient in room, family at bedside Disposition Plan: Anticipate discharge home when fully diuresed   Consultants:     Procedures:   2-D echocardiogram 08/31/2015: EF 30-35% with grade 2 diastolic dysfunction  Antimicrobials:       Subjective: Patient claims he feels somewhat better today. However, still short of breath  Objective: Filed Vitals:   08/31/15 2100 08/31/15 2137 09/01/15 0614 09/01/15 1449  BP:  140/93 153/87 135/81  Pulse:  105 103 103  Temp:  98 F (36.7 C) 98.6 F (37 C) 94.4 F (34.7 C)  TempSrc:  Oral Oral Oral  Resp:  21 22 22   Height:      Weight:   121.065 kg (266 lb 14.4 oz)   SpO2: 99% 99% 94% 94%    Intake/Output Summary (Last 24 hours) at 09/01/15 1700 Last data filed at 09/01/15 1209  Gross per 24 hour  Intake    720 ml  Output   1950 ml  Net  -1230 ml   Filed Weights   08/30/15 1506 08/31/15 0529 09/01/15 0614  Weight: 123.106 kg (271 lb 6.4 oz) 122.607 kg (270 lb 4.8 oz) 121.065 kg (266 lb 14.4 oz)    Examination:  General exam: Appears calm and comfortable, Lying in bed Respiratory system: Clear to auscultation. Respiratory effort normal. Cardiovascular system: S1 & S2 heard, RRR. Gastrointestinal system: Abdomen is nondistended, soft and nontender. No organomegaly or masses felt. Normal bowel sounds heard. Central nervous system: Alert and oriented. No focal neurological deficits. Extremities: Symmetric 5 x 5 power. Skin: No rashes,  lesions Psychiatry: Judgement and insight appear normal. Mood & affect appropriate.     Data Reviewed: I have personally reviewed following labs and imaging studies  CBC:  Recent Labs Lab 08/30/15 1019 08/31/15 0545 09/01/15 0515  WBC 7.8 10.4 9.5  HGB 7.3* 7.0*  7.5*  HCT 26.7* 25.8* 27.2*  MCV 72.2* 72.5* 74.9*  PLT 235 245 0000000   Basic Metabolic Panel:  Recent Labs Lab 08/30/15 1019 08/31/15 0545 09/01/15 0515  NA 139 139 142  K 2.5* 3.1* 3.1*  CL 106 109 106  CO2 26 28 31   GLUCOSE 138* 114* 116*  BUN 14 17 19   CREATININE 1.08 1.18 1.21  CALCIUM 8.4* 8.1* 8.3*  MG  --  2.1  --    GFR: Estimated Creatinine Clearance: 89.6 mL/min (by C-G formula based on Cr of 1.21). Liver Function Tests:  Recent Labs Lab 08/30/15 1019 08/31/15 0545  AST 44* 39  ALT 30 26  ALKPHOS 156* 149*  BILITOT 1.0 0.7  PROT 8.0 7.5  ALBUMIN 3.4* 3.2*   No results for input(s): LIPASE, AMYLASE in the last 168 hours. No results for input(s): AMMONIA in the last 168 hours. Coagulation Profile:  Recent Labs Lab 08/30/15 1019  INR 1.11   Cardiac Enzymes:  Recent Labs Lab 08/30/15 1019 08/30/15 1355  TROPONINI 0.03* 0.03*   BNP (last 3 results) No results for input(s): PROBNP in the last 8760 hours. HbA1C:  Recent Labs  08/30/15 1006  HGBA1C 6.0*   CBG:  Recent Labs Lab 08/31/15 1605 08/31/15 2136 09/01/15 0750 09/01/15 1135 09/01/15 1619  GLUCAP 136* 165* 127* 129* 125*   Lipid Profile: No results for input(s): CHOL, HDL, LDLCALC, TRIG, CHOLHDL, LDLDIRECT in the last 72 hours. Thyroid Function Tests: No results for input(s): TSH, T4TOTAL, FREET4, T3FREE, THYROIDAB in the last 72 hours. Anemia Panel: No results for input(s): VITAMINB12, FOLATE, FERRITIN, TIBC, IRON, RETICCTPCT in the last 72 hours. Sepsis Labs: No results for input(s): PROCALCITON, LATICACIDVEN in the last 168 hours.  No results found for this or any previous visit (from the past 240 hour(s)).       Radiology Studies: No results found.      Scheduled Meds: . antiseptic oral rinse  7 mL Mouth Rinse BID  . buPROPion  150 mg Oral Daily  . furosemide  40 mg Intravenous BID  . gabapentin  400 mg Oral Q8H  . heparin  5,000 Units Subcutaneous Q8H    . insulin aspart  0-15 Units Subcutaneous TID WC  . lisinopril  5 mg Oral Daily  . lurasidone  20 mg Oral Daily  . nicotine  14 mg Transdermal Daily  . potassium chloride  40 mEq Oral BID  . sertraline  200 mg Oral Daily  . sodium chloride flush  3 mL Intravenous Q12H   Continuous Infusions:    LOS: 2 days     CHIU, Orpah Melter, MD [facility] Pager [phone]  If 7PM-7AM, please contact night-coverage [email or web address] Password [facility] 09/01/2015, 5:00 PM

## 2015-09-02 LAB — BASIC METABOLIC PANEL
ANION GAP: 7 (ref 5–15)
BUN: 15 mg/dL (ref 6–20)
CHLORIDE: 104 mmol/L (ref 101–111)
CO2: 30 mmol/L (ref 22–32)
Calcium: 8.6 mg/dL — ABNORMAL LOW (ref 8.9–10.3)
Creatinine, Ser: 1.16 mg/dL (ref 0.61–1.24)
GFR calc non Af Amer: >60 mL/min (ref >60)
Glucose, Bld: 110 mg/dL — ABNORMAL HIGH (ref 65–99)
POTASSIUM: 3.1 mmol/L — AB (ref 3.5–5.1)
SODIUM: 141 mmol/L (ref 135–145)

## 2015-09-02 LAB — GLUCOSE, CAPILLARY
GLUCOSE-CAPILLARY: 181 mg/dL — AB (ref 65–99)
GLUCOSE-CAPILLARY: 118 mg/dL — AB (ref 65–99)
GLUCOSE-CAPILLARY: 120 mg/dL — AB (ref 65–99)
Glucose-Capillary: 138 mg/dL — ABNORMAL HIGH (ref 65–99)

## 2015-09-02 LAB — TSH: TSH: 2.201 u[IU]/mL (ref 0.350–4.500)

## 2015-09-02 MED ORDER — POTASSIUM CHLORIDE CRYS ER 20 MEQ PO TBCR
40.0000 meq | EXTENDED_RELEASE_TABLET | Freq: Two times a day (BID) | ORAL | Status: AC
Start: 2015-09-02 — End: 2015-09-02
  Administered 2015-09-02 (×2): 40 meq via ORAL
  Filled 2015-09-02 (×2): qty 2

## 2015-09-02 NOTE — Progress Notes (Signed)
PROGRESS NOTE    Ruben Stanley  [id number] DOB: 1955/06/27 DOA: 08/30/2015 PCP: No primary care provider on file.    Brief Narrative:  60 y.o. male with medical history significant of tobacco abuse, COPD, diabetes, hypertension, recently diagnosed liver cancer followed by the [place], who presents to the emergency department with complaints of worsening shortness of breath over the past week. Patient does acknowledge symptoms of orthopnea. As patient's breathing worsened, he subsequently presented to the emergency department.  Assessment & Plan:   Principal Problem:   CHF (congestive heart failure) (HCC) Active Problems:   COPD (chronic obstructive pulmonary disease) (HCC)   DM (diabetes mellitus) ([place])   Essential hypertension   Liver cancer (HCC)   HLD (hyperlipidemia)   Acute CHF (congestive heart failure) (HCC)   Hypokalemia   Acute congestive heart failure, combined systolic and diastolic-chronicity unknown -Patient presents with shortness of breath and findings suggestive of volume overload and new congestive heart failure. -Patient presented with elevated BNP of 946 -Will continue patient was scheduled twice a day IV Lasix. -Added lisinopril given new heart failure -We will continue strict I's and O's and daily weights -As of this morning, patient is net - 6.5 L. -Weight on admission 124.2 kg -Current weight 117.7 kg -Creatinine remained stable at present. -We'll continue Lasix as tolerated  Acute hypoxic respiratory failure -Likely secondary to acute congestive heart failure as noted above -Continue to diuresis tolerated. Continue to wean O2 as tolerated -Patient has reported some symptomatic improvement in his breathing. Orthopnea seems to have improved. -Patient remains on 3 L nasal cannula at present  COPD -We'll continue patient with when necessary neb treatments. -No active wheezing at the time of my exam.  -We'll continue patient with PRN  DuoNeb's  Diabetes mellitus -We'll continue patient with supplemental sliding scale insulin  Hypertension -Blood pressure currently stable -Patient now on ACE inhibitor as per above  Liver cancer -Reportedly recently diagnosed at the [facility] -Patient reportedly has a follow-up appointment soon  Hyperlipidemia -Stable  DVT prophylaxis: Heparin subcutaneous Code Status: Full Family Communication: Patient in room, family at bedside Disposition Plan: Anticipate discharge home when fully diuresed   Consultants:     Procedures:   2-D echocardiogram 08/31/2015: EF 30-35% with grade 2 diastolic dysfunction  Antimicrobials:       Subjective: Patient reports feeling overall better. Questioning about going home  Objective: Vitals:   09/01/15 1449 09/01/15 2034 09/02/15 0529 09/02/15 1421  BP: 135/81 (!) 157/80 (!) 146/80 140/79  Pulse: (!) 103 (!) 101 (!) 105 99  Resp: (!) 22 20 20 20   Temp: (!) 94.4 F (34.7 C) 98.8 F (37.1 C) 98.7 F (37.1 C) 98.5 F (36.9 C)  TempSrc: Oral Oral Oral Oral  SpO2: 94% 92% 94% 96%  Weight:   117.8 kg (259 lb 11.2 oz)   Height:        Intake/Output Summary (Last 24 hours) at 09/02/15 1526 Last data filed at 09/02/15 1256  Gross per 24 hour  Intake              480 ml  Output             2850 ml  Net            -2370 ml   Filed Weights   08/31/15 0529 09/01/15 0614 09/02/15 0529  Weight: 122.6 kg (270 lb 4.8 oz) 121.1 kg (266 lb 14.4 oz) 117.8 kg (259 lb 11.2 oz)    Examination:  General  exam: Appears calm and comfortable, Sitting in bed Respiratory system: Clear to auscultation. Respiratory effort normal. Cardiovascular system: S1 & S2 heard, RRR. Gastrointestinal system: Abdomen is nondistended, soft and nontender, obese. No organomegaly or masses felt. Normal bowel sounds heard. Central nervous system: Alert and oriented. No focal neurological deficits. Extremities: Symmetric 5 x 5 power, 2+ lower extremity edema  bilaterally. Skin: No rashes, lesions Psychiatry: Judgement and insight appear normal. Mood & affect appropriate.     Data Reviewed: I have personally reviewed following labs and imaging studies  CBC:  Recent Labs Lab 08/30/15 1019 08/31/15 0545 09/01/15 0515  WBC 7.8 10.4 9.5  HGB 7.3* 7.0* 7.5*  HCT 26.7* 25.8* 27.2*  MCV 72.2* 72.5* 74.9*  PLT 235 245 0000000   Basic Metabolic Panel:  Recent Labs Lab 08/30/15 1019 08/31/15 0545 09/01/15 0515 09/02/15 0655  NA 139 139 142 141  K 2.5* 3.1* 3.1* 3.1*  CL 106 109 106 104  CO2 26 28 31 30   GLUCOSE 138* 114* 116* 110*  BUN 14 17 19 15   CREATININE 1.08 1.18 1.21 1.16  CALCIUM 8.4* 8.1* 8.3* 8.6*  MG  --  2.1  --   --    GFR: Estimated Creatinine Clearance: 92.2 mL/min (by C-G formula based on SCr of 1.16 mg/dL). Liver Function Tests:  Recent Labs Lab 08/30/15 1019 08/31/15 0545  AST 44* 39  ALT 30 26  ALKPHOS 156* 149*  BILITOT 1.0 0.7  PROT 8.0 7.5  ALBUMIN 3.4* 3.2*   No results for input(s): LIPASE, AMYLASE in the last 168 hours. No results for input(s): AMMONIA in the last 168 hours. Coagulation Profile:  Recent Labs Lab 08/30/15 1019  INR 1.11   Cardiac Enzymes:  Recent Labs Lab 08/30/15 1019 08/30/15 1355  TROPONINI 0.03* 0.03*   BNP (last 3 results) No results for input(s): PROBNP in the last 8760 hours. HbA1C: No results for input(s): HGBA1C in the last 72 hours. CBG:  Recent Labs Lab 09/01/15 1135 09/01/15 1619 09/01/15 2033 09/02/15 0740 09/02/15 1103  GLUCAP 129* 125* 131* 138* 181*   Lipid Profile: No results for input(s): CHOL, HDL, LDLCALC, TRIG, CHOLHDL, LDLDIRECT in the last 72 hours. Thyroid Function Tests:  Recent Labs  09/02/15 0655  TSH 2.201   Anemia Panel: No results for input(s): VITAMINB12, FOLATE, FERRITIN, TIBC, IRON, RETICCTPCT in the last 72 hours. Sepsis Labs: No results for input(s): PROCALCITON, LATICACIDVEN in the last 168 hours.  No results  found for this or any previous visit (from the past 240 hour(s)).       Radiology Studies: No results found.      Scheduled Meds: . antiseptic oral rinse  7 mL Mouth Rinse BID  . buPROPion  150 mg Oral Daily  . furosemide  40 mg Intravenous BID  . gabapentin  400 mg Oral Q8H  . heparin  5,000 Units Subcutaneous Q8H  . insulin aspart  0-15 Units Subcutaneous TID WC  . lisinopril  5 mg Oral Daily  . lurasidone  20 mg Oral Daily  . nicotine  14 mg Transdermal Daily  . potassium chloride  40 mEq Oral BID  . sertraline  200 mg Oral Daily  . sodium chloride flush  3 mL Intravenous Q12H   Continuous Infusions:    LOS: 3 days     Morty Ortwein, Orpah Melter, MD [facility] Pager [phone]  If 7PM-7AM, please contact night-coverage [email or web address] Password [facility] 09/02/2015, 3:26 PM

## 2015-09-03 DIAGNOSIS — I5041 Acute combined systolic (congestive) and diastolic (congestive) heart failure: Secondary | ICD-10-CM

## 2015-09-03 DIAGNOSIS — E785 Hyperlipidemia, unspecified: Secondary | ICD-10-CM

## 2015-09-03 DIAGNOSIS — I10 Essential (primary) hypertension: Secondary | ICD-10-CM

## 2015-09-03 DIAGNOSIS — C227 Other specified carcinomas of liver: Secondary | ICD-10-CM

## 2015-09-03 LAB — CBC
HCT: 27.1 % — ABNORMAL LOW (ref 39.0–52.0)
HEMOGLOBIN: 7.3 g/dL — AB (ref 13.0–17.0)
MCH: 20.1 pg — AB (ref 26.0–34.0)
MCHC: 26.9 g/dL — ABNORMAL LOW (ref 30.0–36.0)
MCV: 74.5 fL — ABNORMAL LOW (ref 78.0–100.0)
PLATELETS: 214 10*3/uL (ref 150–400)
RBC: 3.64 MIL/uL — AB (ref 4.22–5.81)
RDW: 24.7 % — ABNORMAL HIGH (ref 11.5–15.5)
WBC: 7.2 10*3/uL (ref 4.0–10.5)

## 2015-09-03 LAB — GLUCOSE, CAPILLARY
GLUCOSE-CAPILLARY: 129 mg/dL — AB (ref 65–99)
GLUCOSE-CAPILLARY: 120 mg/dL — AB (ref 65–99)
Glucose-Capillary: 178 mg/dL — ABNORMAL HIGH (ref 65–99)
Glucose-Capillary: 130 mg/dL — ABNORMAL HIGH (ref 65–99)

## 2015-09-03 LAB — BASIC METABOLIC PANEL
ANION GAP: 9 (ref 5–15)
BUN: 15 mg/dL (ref 6–20)
CALCIUM: 8.8 mg/dL — AB (ref 8.9–10.3)
CO2: 30 mmol/L (ref 22–32)
CREATININE: 1.24 mg/dL (ref 0.61–1.24)
Chloride: 104 mmol/L (ref 101–111)
Glucose, Bld: 109 mg/dL — ABNORMAL HIGH (ref 65–99)
Potassium: 3.4 mmol/L — ABNORMAL LOW (ref 3.5–5.1)
SODIUM: 143 mmol/L (ref 135–145)

## 2015-09-03 MED ORDER — CARVEDILOL 3.125 MG PO TABS
3.1250 mg | ORAL_TABLET | Freq: Two times a day (BID) | ORAL | Status: DC
Start: 2015-09-03 — End: 2015-09-04
  Administered 2015-09-03: 3.125 mg via ORAL
  Filled 2015-09-03 (×2): qty 1

## 2015-09-03 NOTE — Care Management Important Message (Signed)
Important Message  Patient Details  Name: Ruben Stanley MRN: [id number] Date of Birth: 1955/06/23   Medicare Important Message Given:  Yes    Shirin Echeverry, [name], RN 09/03/2015, 10:23 AM

## 2015-09-03 NOTE — Consult Note (Signed)
CARDIOLOGY CONSULT NOTE  Patient ID: Ruben Stanley MRN: [id number] DOB/AGE: Jan 12, 1956 60 y.o.  Admit date: 08/30/2015 Primary Physician: No primary care provider on file. Referring Physician: Wyline Copas MD  Reason for Consultation: CHF  HPI: 60 yr old male with long h/o tobacco use (1 ppd x decades), COPD, diabetes, hypertension, and recent diagnosis of liver cancer admitted on 7/20 with progressive exertional dyspnea, leg swelling, and abdominal distention. Denies h/o chest pain and MI. Denies palpitations. Admits to paroxysmal nocturnal dyspnea but denies orthopnea. BNP elevated 946, troponins unremarkable. Has been on IV Lasix 40 mg bid since admission and has put out nearly 3 L in last 24 hrs. He is feeling much better. ACEI also initiated by hospitalist.  Echo showed severely reduced systolic function, EF [id number], diffuse hypokinesis with severe hypokinesis of inferolateral wall, grade II diastolic dysfunction, and severe LAE.  ECG showed sinus rhythm with nonspecific T wave abnormalities in high lateral leads.  CXR showed pulmonary edema and bilateral pleural effusions.  CT angiography showed no pulmonary embolism but did show cirrhosis of the liver.    Not on File  Current Facility-Administered Medications  Medication Dose Route Frequency Provider Last Rate Last Dose  . antiseptic oral rinse (CPC / CETYLPYRIDINIUM CHLORIDE 0.05%) solution 7 mL  7 mL Mouth Rinse BID Donne Hazel, MD   7 mL at 09/03/15 1000  . buPROPion (WELLBUTRIN XL) 24 hr tablet 150 mg  150 mg Oral Daily Donne Hazel, MD   150 mg at 09/03/15 0825  . carvedilol (COREG) tablet 3.125 mg  3.125 mg Oral BID WC Herminio Commons, MD      . furosemide (LASIX) injection 40 mg  40 mg Intravenous BID Donne Hazel, MD   40 mg at 09/03/15 0825  . gabapentin (NEURONTIN) capsule 400 mg  400 mg Oral Q8H Donne Hazel, MD   400 mg at 09/03/15 1315  . heparin injection 5,000 Units  5,000 Units Subcutaneous  Q8H Donne Hazel, MD   5,000 Units at 09/02/15 1353  . insulin aspart (novoLOG) injection 0-15 Units  0-15 Units Subcutaneous TID WC Donne Hazel, MD   3 Units at 09/03/15 1205  . ipratropium-albuterol (DUONEB) 0.5-2.5 (3) MG/3ML nebulizer solution 3 mL  3 mL Nebulization Q4H PRN Donne Hazel, MD      . ketorolac (TORADOL) 15 MG/ML injection 15 mg  15 mg Intravenous Q6H PRN Donne Hazel, MD   15 mg at 08/30/15 1853  . lisinopril (PRINIVIL,ZESTRIL) tablet 5 mg  5 mg Oral Daily Donne Hazel, MD   5 mg at 09/03/15 0825  . lurasidone (LATUDA) tablet 20 mg  20 mg Oral Daily Donne Hazel, MD   20 mg at 09/03/15 1205  . nicotine (NICODERM CQ - dosed in mg/24 hours) patch 14 mg  14 mg Transdermal Daily Mickel Baas A [place], PA-C   14 mg at 09/03/15 0831  . sertraline (ZOLOFT) tablet 200 mg  200 mg Oral Daily Donne Hazel, MD   200 mg at 09/03/15 0825  . sodium chloride flush (NS) 0.9 % injection 3 mL  3 mL Intravenous Q12H Donne Hazel, MD   3 mL at 09/03/15 0829  . zolpidem (AMBIEN) tablet 5 mg  5 mg Oral QHS PRN Donne Hazel, MD   5 mg at 09/02/15 2200    Past Medical History:  Diagnosis Date  . Cancer [facility])    liver cancer  .  CHF (congestive heart failure) ([place])   . COPD (chronic obstructive pulmonary disease) ([place])   . Diabetes mellitus without complication ([place])   . High cholesterol   . Hypertension   . Neuropathy ([place])     History reviewed. No pertinent surgical history.  Social History   Social History  . Marital status: Married    Spouse name: N/A  . Number of children: N/A  . Years of education: N/A   Occupational History  . Not on file.   Social History Main Topics  . Smoking status: Current Every Day Smoker    Packs/day: 1.00  . Smokeless tobacco: Not on file  . Alcohol use Not on file  . Drug use: No  . Sexual activity: Not on file   Other Topics Concern  . Not on file   Social History Narrative  . No narrative on file     No family history of  premature CAD in 1st degree relatives.  Prior to Admission medications   Medication Sig Start Date End Date Taking? Authorizing Provider  acetaminophen (TYLENOL) 500 MG tablet Take 500 mg by mouth 3 (three) times daily.   Yes Historical Provider, MD  buPROPion (WELLBUTRIN XL) 150 MG 24 hr tablet Take 150 mg by mouth daily.   Yes Historical Provider, MD  cholecalciferol (VITAMIN D) 1000 units tablet Take 1,000 Units by mouth daily.   Yes Historical Provider, MD  gabapentin (NEURONTIN) 400 MG capsule Take 400 mg by mouth every 8 (eight) hours.   Yes Historical Provider, MD  lurasidone (LATUDA) 20 MG TABS tablet Take 20 mg by mouth daily.   Yes Historical Provider, MD  metFORMIN (GLUCOPHAGE) 500 MG tablet Take 500 mg by mouth 2 (two) times daily with a meal.   Yes Historical Provider, MD  sertraline (ZOLOFT) 100 MG tablet Take 200 mg by mouth daily.   Yes Historical Provider, MD     Review of systems complete and found to be negative unless listed above in HPI     Physical exam Blood pressure 124/70, pulse 94, temperature 97.8 F (36.6 C), temperature source Oral, resp. rate 20, height 6\' 1"  (1.854 m), weight 254 lb 9.6 oz (115.5 kg), SpO2 94 %. General: NAD Neck: No JVD, no thyromegaly or thyroid nodule.  Lungs: Diminished throughout, no rales. CV: Nondisplaced PMI. Regular rate and rhythm, normal S1/S2, no S3/S4, no murmur.  Trivial pretibial edema.    Abdomen: Soft, nontender, mild distention.  Skin: Intact without lesions or rashes.  Neurologic: Alert and oriented x 3.  Psych: Normal affect. HEENT: Normal.   ECG: Most recent ECG reviewed.  Labs:   Lab Results  Component Value Date   WBC 7.2 09/03/2015   HGB 7.3 (L) 09/03/2015   HCT 27.1 (L) 09/03/2015   MCV 74.5 (L) 09/03/2015   PLT 214 09/03/2015    Recent Labs Lab 08/31/15 0545  09/03/15 0358  NA 139  < > 143  K 3.1*  < > 3.4*  CL 109  < > 104  CO2 28  < > 30  BUN 17  < > 15  CREATININE 1.18  < > 1.24  CALCIUM  8.1*  < > 8.8*  PROT 7.5  --   --   BILITOT 0.7  --   --   ALKPHOS 149*  --   --   ALT 26  --   --   AST 39  --   --   GLUCOSE 114*  < > 109*  < > =  values in this interval not displayed. Lab Results  Component Value Date   TROPONINI 0.03 ([place]) 08/30/2015   No results found for: CHOL No results found for: HDL No results found for: LDLCALC No results found for: TRIG No results found for: CHOLHDL No results found for: LDLDIRECT       Studies: No results found.  ASSESSMENT AND PLAN:  1. New onset acute systolic and diastolic heart failure: Diuresing well on current regimen. Continue IV Lasix 40 mg bid for now. On lisinopril. Will add Coreg 3.125 mg bid. I would consider Entresto or Bidil in the outpatient setting. Given marked inferolateral hypokinesis, and ischemic etiology will need to be ruled out. I will defer nuclear stress testing to the outpatient setting. However, given marked anemia, I am not inclined to initiate ASA at this time. Once he is on optimal medical therapy for at least 3 months, a repeat echocardiogram will need to be performed to assess for improvement in LVEF.  With respect to needing an AICD, this will need to be evaluated in the context of his liver cancer and overall prognosis.  2. Essential HTN: Controlled on ACEI. Monitor given initiation of Coreg.   Signed: Kate Sable, M.D., F.A.C.C.  09/03/2015, 4:19 PM

## 2015-09-03 NOTE — Progress Notes (Signed)
PROGRESS NOTE    Ruben Stanley  [id number] DOB: [birth year] DOA: 08/30/2015 PCP: No primary care provider on file.    Brief Narrative:  60 y.o. male with medical history significant of tobacco abuse, COPD, diabetes, hypertension, recently diagnosed liver cancer followed by the [facility], who presents to the emergency department with complaints of worsening shortness of breath over the past week. Patient does acknowledge symptoms of orthopnea. As patient's breathing worsened, he subsequently presented to the emergency department.  Assessment & Plan:   Principal Problem:   CHF (congestive heart failure) (H[facility]) Active Problems:   COPD (chronic obstructive pulmonary disease) (H[facility])   DM (diabetes mellitus) ([place])   Essential hypertension   Liver cancer (HCC)   HLD (hyperlipidemia)   Acute CHF (congestive heart failure) (H[facility])   Hypokalemia   Acute congestive heart failure, combined systolic and diastolic-chronicity unknown -Patient presents with shortness of breath and findings suggestive of volume overload and new congestive heart failure. -Patient presented with elevated BNP of 946 -Will continue patient was scheduled twice a day IV Lasix. -Added lisinopril given new heart failure -We will continue strict I's and O's and daily weights -As of this morning, patient is net - 7.8 L. -Weight on admission 124.2 kg -Current weight 115.4 kg -Creatinine remained stable at present. -We'll continue Lasix as tolerated -Given new diagnosis of severe heart failure, have consulted Cardiology  Acute hypoxic respiratory failure -Likely secondary to acute congestive heart failure as noted above -Continue to diuresis tolerated. Continue to wean O2 as tolerated -Patient has reported some symptomatic improvement in his breathing. Orthopnea seems to have improved. -Patient remains on 3 L nasal cannula at present, wean as tolerated  COPD -We'll continue patient with when necessary neb treatments. -No  active wheezing at the time of my exam.  -We'll continue patient with PRN DuoNeb's  Diabetes mellitus -We'll continue patient with supplemental sliding scale insulin  Hypertension -Blood pressure currently stable -Patient now on ACE inhibitor as per above  Liver cancer -Reportedly recently diagnosed at the [facility] -Patient reportedly has a follow-up appointment soon  Hyperlipidemia -Stable  DVT prophylaxis: Heparin subcutaneous Code Status: Full Family Communication: Patient in room, family at bedside Disposition Plan: Anticipate discharge home when fully diuresed   Consultants:   Cardiology  Procedures:   2-D echocardiogram 08/31/2015: EF 30-35% with grade 2 diastolic dysfunction  Antimicrobials:       Subjective: States feeling better. Eager to go home soon.  Objective: Vitals:   09/02/15 1421 09/02/15 2134 09/03/15 0552 09/03/15 1401  BP: 140/79 (!) 150/81 (!) 154/84 124/70  Pulse: 99 (!) 101 (!) 102 94  Resp: 20 20 (!) 22 20  Temp: 98.5 F (36.9 C) 98.7 F (37.1 C) 98.8 F (37.1 C) 97.8 F (36.6 C)  TempSrc: Oral Oral Oral Oral  SpO2: 96% 95% 96% 94%  Weight:   115.5 kg (254 lb 9.6 oz)   Height:        Intake/Output Summary (Last 24 hours) at 09/03/15 1622 Last data filed at 09/03/15 1300  Gross per 24 hour  Intake              696 ml  Output             1500 ml  Net             -804 ml   Filed Weights   09/01/15 0614 09/02/15 0529 09/03/15 0552  Weight: 121.1 kg (266 lb 14.4 oz) 117.8 kg (259 lb 11.2 oz) 115.5  kg (254 lb 9.6 oz)    Examination:  General exam: Appears calm and comfortable, Sitting at side of bed Respiratory system: Clear to auscultation. Respiratory effort normal. Cardiovascular system: S1 & S2 heard, RRR. Gastrointestinal system: Abdomen is nondistended, soft and nontender, obese. No organomegaly or masses felt. Normal bowel sounds heard. Central nervous system: Alert and oriented. No focal neurological deficits. Extremities:  Symmetric 5 x 5 power, 2+ lower extremity edema bilaterally. Skin: No rashes, lesions Psychiatry: Judgement and insight appear normal. Mood & affect appropriate.     Data Reviewed: I have personally reviewed following labs and imaging studies  CBC:  Recent Labs Lab 08/30/15 1019 08/31/15 0545 09/01/15 0515 09/03/15 0358  WBC 7.8 10.4 9.5 7.2  HGB 7.3* 7.0* 7.5* 7.3*  HCT 26.7* 25.8* 27.2* 27.1*  MCV 72.2* 72.5* 74.9* 74.5*  PLT 235 245 233 [id number]   Basic Metabolic Panel:  Recent Labs Lab 08/30/15 1019 08/31/15 0545 09/01/15 0515 09/02/15 0655 09/03/15 0358  NA 139 139 142 141 143  K 2.5* 3.1* 3.1* 3.1* 3.4*  CL 106 109 106 104 104  CO2 26 28 31 30 30   GLUCOSE 138* 114* 116* 110* 109*  BUN 14 17 19 15 15   CREATININE 1.08 1.18 1.21 1.16 1.24  CALCIUM 8.4* 8.1* 8.3* 8.6* 8.8*  MG  --  2.1  --   --   --    GFR: Estimated Creatinine Clearance: 85.4 mL/min (by C-G formula based on SCr of 1.24 mg/dL). Liver Function Tests:  Recent Labs Lab 08/30/15 1019 08/31/15 0545  AST 44* 39  ALT 30 26  ALKPHOS 156* 149*  BILITOT 1.0 0.7  PROT 8.0 7.5  ALBUMIN 3.4* 3.2*   No results for input(s): LIPASE, AMYLASE in the last 168 hours. No results for input(s): AMMONIA in the last 168 hours. Coagulation Profile:  Recent Labs Lab 08/30/15 1019  INR 1.11   Cardiac Enzymes:  Recent Labs Lab 08/30/15 1019 08/30/15 1355  TROPONINI 0.03* 0.03*   BNP (last 3 results) No results for input(s): PROBNP in the last 8760 hours. HbA1C: No results for input(s): HGBA1C in the last 72 hours. CBG:  Recent Labs Lab 09/02/15 1103 09/02/15 1614 09/02/15 2120 09/03/15 0731 09/03/15 1150  GLUCAP 181* 118* 120* 129* 178*   Lipid Profile: No results for input(s): CHOL, HDL, LDLCALC, TRIG, CHOLHDL, LDLDIRECT in the last 72 hours. Thyroid Function Tests:  Recent Labs  09/02/15 0655  TSH 2.201   Anemia Panel: No results for input(s): VITAMINB12, FOLATE, FERRITIN, TIBC,  IRON, RETICCTPCT in the last 72 hours. Sepsis Labs: No results for input(s): PROCALCITON, LATICACIDVEN in the last 168 hours.  No results found for this or any previous visit (from the past 240 hour(s)).       Radiology Studies: No results found.      Scheduled Meds: . antiseptic oral rinse  7 mL Mouth Rinse BID  . buPROPion  150 mg Oral Daily  . carvedilol  3.125 mg Oral BID WC  . furosemide  40 mg Intravenous BID  . gabapentin  400 mg Oral Q8H  . heparin  5,000 Units Subcutaneous Q8H  . insulin aspart  0-15 Units Subcutaneous TID WC  . lisinopril  5 mg Oral Daily  . lurasidone  20 mg Oral Daily  . nicotine  14 mg Transdermal Daily  . sertraline  200 mg Oral Daily  . sodium chloride flush  3 mL Intravenous Q12H   Continuous Infusions:    LOS: 4 days  Deloma Spindle, Orpah Melter, MD [facility] Pager [phone]  If 7PM-7AM, please contact night-coverage [email or web address] Password [facility] 09/03/2015, 4:22 PM

## 2015-09-04 DIAGNOSIS — I5021 Acute systolic (congestive) heart failure: Secondary | ICD-10-CM

## 2015-09-04 DIAGNOSIS — E876 Hypokalemia: Secondary | ICD-10-CM

## 2015-09-04 DIAGNOSIS — I519 Heart disease, unspecified: Secondary | ICD-10-CM

## 2015-09-04 LAB — CBC
HCT: 26.6 % — ABNORMAL LOW (ref 39.0–52.0)
Hemoglobin: 7.3 g/dL — ABNORMAL LOW (ref 13.0–17.0)
MCH: 20.3 pg — AB (ref 26.0–34.0)
MCHC: 27.4 g/dL — AB (ref 30.0–36.0)
MCV: 73.9 fL — ABNORMAL LOW (ref 78.0–100.0)
Platelets: 223 10*3/uL (ref 150–400)
RBC: 3.6 MIL/uL — AB (ref 4.22–5.81)
RDW: 23.7 % — AB (ref 11.5–15.5)
WBC: 6.7 10*3/uL (ref 4.0–10.5)

## 2015-09-04 LAB — BASIC METABOLIC PANEL
Anion gap: 6 (ref 5–15)
BUN: 16 mg/dL (ref 6–20)
CALCIUM: 8.8 mg/dL — AB (ref 8.9–10.3)
CO2: 31 mmol/L (ref 22–32)
CREATININE: 1.26 mg/dL — AB (ref 0.61–1.24)
Chloride: 104 mmol/L (ref 101–111)
GFR calc non Af Amer: >60 mL/min (ref >60)
GLUCOSE: 103 mg/dL — AB (ref 65–99)
Potassium: 3.2 mmol/L — ABNORMAL LOW (ref 3.5–5.1)
Sodium: 141 mmol/L (ref 135–145)

## 2015-09-04 LAB — GLUCOSE, CAPILLARY
GLUCOSE-CAPILLARY: 135 mg/dL — AB (ref 65–99)
Glucose-Capillary: 103 mg/dL — ABNORMAL HIGH (ref 65–99)
Glucose-Capillary: 115 mg/dL — ABNORMAL HIGH (ref 65–99)
Glucose-Capillary: 124 mg/dL — ABNORMAL HIGH (ref 65–99)

## 2015-09-04 MED ORDER — CARVEDILOL 3.125 MG PO TABS
6.2500 mg | ORAL_TABLET | Freq: Two times a day (BID) | ORAL | Status: DC
  Administered 2015-09-04 – 2015-09-05 (×2): 6.25 mg via ORAL
  Filled 2015-09-04 (×2): qty 2

## 2015-09-04 MED ORDER — POTASSIUM CHLORIDE CRYS ER 20 MEQ PO TBCR
40.0000 meq | EXTENDED_RELEASE_TABLET | Freq: Two times a day (BID) | ORAL | Status: AC
  Administered 2015-09-04 (×2): 40 meq via ORAL
  Filled 2015-09-04 (×3): qty 2

## 2015-09-04 NOTE — Evaluation (Signed)
Physical Therapy Evaluation Patient Details Name: Ruben Stanley MRN: [id number] DOB: 1955/12/08 Today's Date: 09/04/2015   History of Present Illness  60 yo M admitted 08/30/2015 with shortness of breath. Dx: New onset acute on chronic mixed heart failure with EF of 30-35%.  07/25 Hgb: 7.3, and Hct: 26.6.  PMH: tobacco abuse, COPD, DM, HTN, [facility] CA [facility], neuropathy, CHF.   Clinical Impression  Pt received in bed, and was agreeable to PT evaluation.  Prior to admission, pt was independent with ambulation, and all ADL's and IADL's, driving, and he lives with his mother.  During PT, pt demonstrated O2 desaturation at rest down to 84% on RA.  Pt placed back on 3L of O2, and improved back to 92%.  During ambulation SpO2 was maintained >93% on 3L.  He ambulated 569ft independently.  Pt does not demonstrate need for further skilled PT, will sign off at this time.     Follow Up Recommendations No PT follow up    Equipment Recommendations  None recommended by PT    Recommendations for Other Services       Precautions / Restrictions Precautions Precautions: None Restrictions Weight Bearing Restrictions: No      Mobility  Bed Mobility Overal bed mobility: Modified Independent                Transfers Overall transfer level: Modified independent                  Ambulation/Gait Ambulation/Gait assistance: Independent Ambulation Distance (Feet): 500 Feet   Gait Pattern/deviations: WFL(Within Functional Limits)     General Gait Details: up and down ramps.  SpO2 remained >93% on 3L.   Stairs            Wheelchair Mobility    Modified Rankin (Stroke Patients Only)       Balance Overall balance assessment: No apparent balance deficits (not formally assessed)                                           Pertinent Vitals/Pain Pain Assessment: No/denies pain    Home Living   Living Arrangements: Parent (Pt lives with  his mother)   Type of Home: House Home Access: Stairs to enter Entrance Stairs-Rails: Can reach both Entrance Stairs-Number of Steps: 4 Home Layout: One level Home Equipment: Bedside commode      Prior Function Level of Independence: Independent               Hand Dominance   Dominant Hand: Right    Extremity/Trunk Assessment   Upper Extremity Assessment: Overall WFL for tasks assessed           Lower Extremity Assessment: Overall WFL for tasks assessed         Communication   Communication: No difficulties  Cognition Arousal/Alertness: Awake/alert Behavior During Therapy: WFL for tasks assessed/performed Overall Cognitive Status: Within Functional Limits for tasks assessed                      General Comments      Exercises        Assessment/Plan    PT Assessment Patent does not need any further PT services  PT Diagnosis Generalized weakness   PT Problem List    PT Treatment Interventions     PT Goals (Current goals can be  found in the Care Plan section) Acute Rehab PT Goals PT Goal Formulation: All assessment and education complete, DC therapy    Frequency     Barriers to discharge        Co-evaluation               End of Session Equipment Utilized During Treatment: Gait belt;Oxygen Activity Tolerance: Patient tolerated treatment well Patient left:  (sitting on the EOB)      Functional Assessment Tool Used: [employer] "6-clicks"  Functional Limitation: Mobility: Walking and moving around Mobility: Walking and Moving Around Current Status [phone]): At least 1 percent but less than 20 percent impaired, limited or restricted Mobility: Walking and Moving Around Goal Status [phone]): At least 1 percent but less than 20 percent impaired, limited or restricted Mobility: Walking and Moving Around Discharge Status [phone]): At least 1 percent but less than 20 percent impaired, limited or restricted    Time:  1026-1044 PT Time Calculation (min) (ACUTE ONLY): 18 min   Charges:   PT Evaluation $PT Eval Low Complexity: 1 Procedure     PT G Codes:   PT G-Codes **NOT FOR INPATIENT CLASS** Functional Assessment Tool Used: [employer] "6-clicks"  Functional Limitation: Mobility: Walking and moving around Mobility: Walking and Moving Around Current Status [phone]): At least 1 percent but less than 20 percent impaired, limited or restricted Mobility: Walking and Moving Around Goal Status [phone]): At least 1 percent but less than 20 percent impaired, limited or restricted Mobility: Walking and Moving Around Discharge Status [phone]): At least 1 percent but less than 20 percent impaired, limited or restricted    Beth Sharone Picchi, PT, DPT X: [phone]

## 2015-09-04 NOTE — Progress Notes (Signed)
Primary Cardiologist: Kate Sable MD  Cardiology Specific Problem List: 1. New Onset Acute on Chronic Mixed Heart Failure 2. Hypertension  Subjective:   Feeling better, breathing better. Continues on O2. NO chest pain or palpitations.    Objective:   Temp:  [97.8 F (36.6 C)-98.9 F (37.2 C)] 98.2 F (36.8 C) (07/25 0431) Pulse Rate:  [76-99] 99 (07/25 0431) Resp:  [20-22] 22 (07/25 0431) BP: (124-182)/(66-100) 124/85 (07/25 0431) SpO2:  [94 %-99 %] 99 % (07/25 0431) Weight:  [253 lb 6.4 oz (114.9 kg)] 253 lb 6.4 oz (114.9 kg) (07/25 0431) Last BM Date: 09/03/15  Filed Weights   09/02/15 0529 09/03/15 0552 09/04/15 0431  Weight: 259 lb 11.2 oz (117.8 kg) 254 lb 9.6 oz (115.5 kg) 253 lb 6.4 oz (114.9 kg)    Intake/Output Summary (Last 24 hours) at 09/04/15 0748 Last data filed at 09/04/15 0431  Gross per 24 hour  Intake              576 ml  Output             1700 ml  Net            -1124 ml    Telemetry: NSR rates in the 90's. No arrhthymias   Exam:  General: No acute distress.  HEENT: Conjunctiva and lids normal, oropharynx clear.  Lungs: Clear to auscultation, nonlabored.  Cardiac: No elevated JVP or bruits. RRR, no gallop or rub.   Abdomen: Normoactive bowel sounds, nontender, nondistended. Obese.  Extremities: No pitting edema, distal pulses full.  Neuropsychiatric: Alert and oriented x3, affect appropriate.   Lab Results:  Basic Metabolic Panel:  Recent Labs Lab 08/31/15 0545  09/02/15 0655 09/03/15 0358 09/04/15 0502  NA 139  < > 141 143 141  K 3.1*  < > 3.1* 3.4* 3.2*  CL 109  < > 104 104 104  CO2 28  < > 30 30 31   GLUCOSE 114*  < > 110* 109* 103*  BUN 17  < > 15 15 16   CREATININE 1.18  < > 1.16 1.24 1.26*  CALCIUM 8.1*  < > 8.6* 8.8* 8.8*  MG 2.1  --   --   --   --   < > = values in this interval not displayed.  Liver Function Tests:  Recent Labs Lab 08/30/15 1019 08/31/15 0545  AST 44* 39  ALT 30 26  ALKPHOS  156* 149*  BILITOT 1.0 0.7  PROT 8.0 7.5  ALBUMIN 3.4* 3.2*    CBC:  Recent Labs Lab 09/01/15 0515 09/03/15 0358 09/04/15 0502  WBC 9.5 7.2 6.7  HGB 7.5* 7.3* 7.3*  HCT 27.2* 27.1* 26.6*  MCV 74.9* 74.5* 73.9*  PLT 233 214 223    Cardiac Enzymes:  Recent Labs Lab 08/30/15 1019 08/30/15 1355  TROPONINI 0.03* 0.03*   Echocardiogram 08/31/2015 Left ventricle: The cavity size was mildly dilated. Wall   thickness was increased in a pattern of mild LVH. Systolic   function was moderately to severely reduced. The estimated   ejection fraction was in the range of 30% to 35%. Diffuse   hypokinesis. There is severe hypokinesis of the inferolateral   myocardium. Features are consistent with a pseudonormal left   ventricular filling pattern, with concomitant abnormal relaxation   and increased filling pressure (grade 2 diastolic dysfunction). - Aortic valve: Mildly calcified annulus. Trileaflet. - Mitral valve: There was mild regurgitation. - Left atrium: The atrium was severely dilated. -  Right atrium: Central venous pressure (est): 8 mm Hg. - Atrial septum: A patent foramen ovale cannot be excluded. - Tricuspid valve: There was trivial regurgitation. - Pulmonary arteries: Systolic pressure could not be accurately   estimated. - Pericardium, extracardiac: There was no pericardial effusion.  Coagulation:  Recent Labs Lab 08/30/15 1019  INR 1.11     Medications:   Scheduled Medications: . antiseptic oral rinse  7 mL Mouth Rinse BID  . buPROPion  150 mg Oral Daily  . carvedilol  3.125 mg Oral BID WC  . furosemide  40 mg Intravenous BID  . gabapentin  400 mg Oral Q8H  . heparin  5,000 Units Subcutaneous Q8H  . insulin aspart  0-15 Units Subcutaneous TID WC  . lisinopril  5 mg Oral Daily  . lurasidone  20 mg Oral Daily  . nicotine  14 mg Transdermal Daily  . potassium chloride  40 mEq Oral BID  . sertraline  200 mg Oral Daily  . sodium chloride flush  3 mL  Intravenous Q12H        PRN Medications:  ipratropium-albuterol, ketorolac, zolpidem   Assessment and Plan:   1.Acute on Chronic Mixed CHF: EF of 30-35%, with grade 2 diastolic dysfunction. He has diuresed 9 liters since admission with 1.124 over last 24 hours. He is on a fluid restriction of 1500 cc. He is symptomatically improved, with no  Evidence of overt fluid overload. Abdomen is obese, but no distention, mild ballotment . Creatinine 1.26, CO2 31. Would continue IV lasix another 24 hours as he continues to diurese significantly. He is tachycardic on review of tele and on assessment. HR 90's. Will increase carvedilol to 6.25 mg BID.  Currently on lisinopril. Consider changing to ARB and then transition to [facility].  2. Hypertension: BP is controlled currently. Will monitor with increased dose of coreg.   Phill Myron. Lawrence NP [place]  09/04/2015, 7:48 AM   The patient was seen and examined, and I agree with the physical exam, assessment and plan as documented above by K. Lawrence, with modifications as noted below.  Pt feels well. Hoping to go home tomorrow.  Coreg just started yesterday. Can try 6.25 mg bid but monitor BP. Creatinine rising and he has had adequate output. I agree with IV Lasix for one more day but then starting oral Lasix 40 mg bid tomorrow.  I would consider Entresto or Bidil in the outpatient setting. Given marked inferolateral hypokinesis, and ischemic etiology will need to be ruled out. I will defer nuclear stress testing to the outpatient setting. However, given marked anemia, I am not inclined to initiate ASA at this time. Once he is on optimal medical therapy for at least 3 months, a repeat echocardiogram will need to be performed to assess for improvement in LVEF.  With respect to needing an AICD, this will need to be evaluated in the context of his liver cancer and overall prognosis.  Kate Sable, MD, [facility]  09/04/2015 9:11 AM

## 2015-09-04 NOTE — Progress Notes (Signed)
PROGRESS NOTE    Ruben Stanley  [id number] DOB: 1955-12-11 DOA: 08/30/2015 PCP: No primary care provider on file.    Brief Narrative:  60 y.o. male with medical history significant of tobacco abuse, COPD, diabetes, hypertension, recently diagnosed liver cancer followed by the [place], who presents to the emergency department with complaints of worsening shortness of breath over the past week. Patient does acknowledge symptoms of orthopnea. As patient's breathing worsened, he subsequently presented to the emergency department.  Assessment & Plan:   Principal Problem:   CHF (congestive heart failure) (HCC) Active Problems:   COPD (chronic obstructive pulmonary disease) (HCC)   DM (diabetes mellitus) ([place])   Essential hypertension   Liver cancer (HCC)   HLD (hyperlipidemia)   Acute CHF (congestive heart failure) (HCC)   Hypokalemia   Acute congestive heart failure, combined systolic and diastolic-chronicity unknown -Patient presents with shortness of breath and findings suggestive of volume overload and new congestive heart failure. -Patient presented with elevated BNP of 946 -Will continue patient was scheduled twice a day IV Lasix. -Added lisinopril given new heart failure -We will continue strict I's and O's and daily weights -As of this morning, patient is net - 7.8 L. -Weight on admission 124.2 kg -Current weight 114.9 kg -Creatinine slowly trending up. -We'll continue Lasix as tolerated. Plans for possible transition to PO lasix tomorrow -On coreg -Given new diagnosis of severe heart failure, have consulted Cardiology. Recs for outpatient ischemic w/u. Given anemia, avoid ASA for the time being  Acute hypoxic respiratory failure -Likely secondary to acute congestive heart failure as noted above -Continue to diuresis tolerated. Continue to wean O2 as tolerated -Patient has reported some symptomatic improvement in his breathing. Orthopnea seems to have improved. -Patient  remains on 3 L nasal cannula at present, wean as tolerated  COPD -We'll continue patient with when necessary neb treatments. -No active wheezing at the time of my exam.  -We'll continue patient with PRN DuoNeb's  Diabetes mellitus -We'll continue patient with supplemental sliding scale insulin  Hypertension -Blood pressure currently stable -Patient now on ACE inhibitor as per above  Liver cancer -Reportedly recently diagnosed at the [facility] -Patient reportedly has a follow-up appointment soon  Hyperlipidemia -Stable  DVT prophylaxis: Heparin subcutaneous Code Status: Full Family Communication: Patient in room, family at bedside Disposition Plan: Anticipate discharge home when fully diuresed, possibly in the next 24-48hrs   Consultants:   Cardiology  Procedures:   2-D echocardiogram 08/31/2015: EF 30-35% with grade 2 diastolic dysfunction  Antimicrobials:       Subjective: Eager to go home. Reports feeling less sob  Objective: Vitals:   09/03/15 2202 09/04/15 0431 09/04/15 1131 09/04/15 1132  BP:  124/85    Pulse:  99 96 (!) 115  Resp:  (!) 22    Temp:  98.2 F (36.8 C)    TempSrc:  Oral    SpO2: 99% 99% (!) 84% 93%  Weight:  114.9 kg (253 lb 6.4 oz)    Height:        Intake/Output Summary (Last 24 hours) at 09/04/15 1540 Last data filed at 09/04/15 0900  Gross per 24 hour  Intake              120 ml  Output             1400 ml  Net            -1280 ml   Filed Weights   09/02/15 0529 09/03/15 0552 09/04/15 0431  Weight: 117.8 kg (259 lb 11.2 oz) 115.5 kg (254 lb 9.6 oz) 114.9 kg (253 lb 6.4 oz)    Examination:  General exam: Appears calm and comfortable, Laying in bed Respiratory system: Clear to auscultation. Respiratory effort normal. Cardiovascular system: S1 & S2 heard, RRR. Gastrointestinal system: Abdomen is nondistended, soft and nontender, obese. No organomegaly or masses felt. Normal bowel sounds heard. Central nervous system: Alert and  oriented. No focal neurological deficits. Extremities: Symmetric 5 x 5 power, 1+ lower extremity edema bilaterally. Skin: No rashes, lesions Psychiatry: Judgement and insight appear normal. Mood & affect appropriate.     Data Reviewed: I have personally reviewed following labs and imaging studies  CBC:  Recent Labs Lab 08/30/15 1019 08/31/15 0545 09/01/15 0515 09/03/15 0358 09/04/15 0502  WBC 7.8 10.4 9.5 7.2 6.7  HGB 7.3* 7.0* 7.5* 7.3* 7.3*  HCT 26.7* 25.8* 27.2* 27.1* 26.6*  MCV 72.2* 72.5* 74.9* 74.5* 73.9*  PLT 235 245 233 214 [id number]   Basic Metabolic Panel:  Recent Labs Lab 08/31/15 0545 09/01/15 0515 09/02/15 0655 09/03/15 0358 09/04/15 0502  NA 139 142 141 143 141  K 3.1* 3.1* 3.1* 3.4* 3.2*  CL 109 106 104 104 104  CO2 28 31 30 30 31   GLUCOSE 114* 116* 110* 109* 103*  BUN 17 19 15 15 16   CREATININE 1.18 1.21 1.16 1.24 1.26*  CALCIUM 8.1* 8.3* 8.6* 8.8* 8.8*  MG 2.1  --   --   --   --    GFR: Estimated Creatinine Clearance: 83.8 mL/min (by C-G formula based on SCr of 1.26 mg/dL). Liver Function Tests:  Recent Labs Lab 08/30/15 1019 08/31/15 0545  AST 44* 39  ALT 30 26  ALKPHOS 156* 149*  BILITOT 1.0 0.7  PROT 8.0 7.5  ALBUMIN 3.4* 3.2*   No results for input(s): LIPASE, AMYLASE in the last 168 hours. No results for input(s): AMMONIA in the last 168 hours. Coagulation Profile:  Recent Labs Lab 08/30/15 1019  INR 1.11   Cardiac Enzymes:  Recent Labs Lab 08/30/15 1019 08/30/15 1355  TROPONINI 0.03* 0.03*   BNP (last 3 results) No results for input(s): PROBNP in the last 8760 hours. HbA1C: No results for input(s): HGBA1C in the last 72 hours. CBG:  Recent Labs Lab 09/03/15 1150 09/03/15 1625 09/03/15 2021 09/04/15 0824 09/04/15 1141  GLUCAP 178* 120* 130* 135* 103*   Lipid Profile: No results for input(s): CHOL, HDL, LDLCALC, TRIG, CHOLHDL, LDLDIRECT in the last 72 hours. Thyroid Function Tests:  Recent Labs   09/02/15 0655  TSH 2.201   Anemia Panel: No results for input(s): VITAMINB12, FOLATE, FERRITIN, TIBC, IRON, RETICCTPCT in the last 72 hours. Sepsis Labs: No results for input(s): PROCALCITON, LATICACIDVEN in the last 168 hours.  No results found for this or any previous visit (from the past 240 hour(s)).       Radiology Studies: No results found.      Scheduled Meds: . antiseptic oral rinse  7 mL Mouth Rinse BID  . buPROPion  150 mg Oral Daily  . carvedilol  6.25 mg Oral BID WC  . furosemide  40 mg Intravenous BID  . gabapentin  400 mg Oral Q8H  . heparin  5,000 Units Subcutaneous Q8H  . insulin aspart  0-15 Units Subcutaneous TID WC  . lisinopril  5 mg Oral Daily  . lurasidone  20 mg Oral Daily  . nicotine  14 mg Transdermal Daily  . potassium chloride  40 mEq Oral BID  .  sertraline  200 mg Oral Daily  . sodium chloride flush  3 mL Intravenous Q12H   Continuous Infusions:    LOS: 5 days     CHIU, Orpah Melter, MD [facility] Pager [phone]  If 7PM-7AM, please contact night-coverage [email or web address] Password [facility] 09/04/2015, 3:40 PM

## 2015-09-05 DIAGNOSIS — D508 Other iron deficiency anemias: Secondary | ICD-10-CM

## 2015-09-05 LAB — GLUCOSE, CAPILLARY
GLUCOSE-CAPILLARY: 104 mg/dL — AB (ref 65–99)
Glucose-Capillary: 170 mg/dL — ABNORMAL HIGH (ref 65–99)

## 2015-09-05 LAB — BASIC METABOLIC PANEL
ANION GAP: 4 — AB (ref 5–15)
BUN: 15 mg/dL (ref 6–20)
CALCIUM: 8.4 mg/dL — AB (ref 8.9–10.3)
CO2: 30 mmol/L (ref 22–32)
Chloride: 102 mmol/L (ref 101–111)
Creatinine, Ser: 1.36 mg/dL — ABNORMAL HIGH (ref 0.61–1.24)
GFR, EST NON AFRICAN AMERICAN: 55 mL/min — AB (ref >60)
GLUCOSE: 206 mg/dL — AB (ref 65–99)
Potassium: 3.7 mmol/L (ref 3.5–5.1)
Sodium: 136 mmol/L (ref 135–145)

## 2015-09-05 MED ORDER — CARVEDILOL 6.25 MG PO TABS: 6.2500 mg | ORAL_TABLET | Freq: Two times a day (BID) | ORAL | 2 refills | Status: AC

## 2015-09-05 MED ORDER — FUROSEMIDE 40 MG PO TABS
40.0000 mg | ORAL_TABLET | Freq: Two times a day (BID) | ORAL | Status: DC
  Administered 2015-09-05: 40 mg via ORAL
  Filled 2015-09-05: qty 1

## 2015-09-05 MED ORDER — LISINOPRIL 5 MG PO TABS: 5.0000 mg | ORAL_TABLET | Freq: Every day | ORAL | 1 refills | Status: DC

## 2015-09-05 MED ORDER — FUROSEMIDE 40 MG PO TABS: 40.0000 mg | ORAL_TABLET | Freq: Two times a day (BID) | ORAL | 2 refills | Status: AC

## 2015-09-05 NOTE — Discharge Summary (Signed)
Physician Discharge Summary  Ruben Stanley [id number] DOB: [birth year] DOA: 08/30/2015  PCP: No primary care provider on file.  Admit date: 08/30/2015 Discharge date: 09/05/2015  Time spent: 45 minutes  Recommendations for Outpatient Follow-up:  We'll be discharge home today 2 follow-up with cardiology as scheduled   Discharge Diagnoses:  Principal Problem:   CHF (congestive heart failure) ([place]) Active Problems:   COPD (chronic obstructive pulmonary disease) ([place])   DM (diabetes mellitus) ([place])   Essential hypertension   Liver cancer ([place])   HLD (hyperlipidemia)   Acute congestive heart failure ([place])   Hypokalemia   Discharge Condition: Stable and improved  Filed Weights   09/03/15 0552 09/04/15 0431 09/05/15 0500  Weight: 115.5 kg (254 lb 9.6 oz) 114.9 kg (253 lb 6.4 oz) 114.8 kg (253 lb)    History of present illness:  As per Ruben Stanley on 7/20: Ruben Stanley is a 60 y.o. male with medical history significant of tobacco abuse, COPD, diabetes, hypertension, recently diagnosed liver cancer followed by the [place], who presents to the emergency department with complaints of worsening shortness of breath over the past week. Patient does acknowledge symptoms of orthopnea. As patient's breathing worsened, he subsequently presented to the emergency department.  ED Course: In emergency department, patient was noted have an elevated BNP of under 1000. Chest x-ray was notable for findings of volume overload. In addition, patient was noted to be hypoxic on room air, improving with O2 via nasal cannula. Patient was given 1 dose of Solu-Medrol, neb treatment, one dose of IV Lasix with good urine output. Hospital service consulted for consideration for mission given concerns of new heart failure.  Hospital Course:   Acute combined CHF, new onset -Echo with ejection fraction of 30-35% and grade 2 diastolic dysfunction. -Has diuresed over 10 L since admission. -Has been seen by  cardiology, started on Coreg, lisinopril, Lasix. -Will see cardiology as an outpatient to discuss further ischemic workup for his new onset CHF.  Acute hypoxemic respiratory failure -Due to acute CHF -Will perform qualifying O2 sats saturations prior to discharge, if still requiring oxygen will arrange.  Liver cancer -Recently diagnosed at the [facility], will follow-up with them as an outpatient.  Hypertension -Well-controlled  Type 2 diabetes -Continue outpatient follow-up, continue metformin.  Procedures:  None   Consultations:  Cardiology  Discharge Instructions  Discharge Instructions    Diet - low sodium heart healthy    Complete by:  As directed   Increase activity slowly    Complete by:  As directed       Medication List    TAKE these medications   acetaminophen 500 MG tablet Commonly known as:  TYLENOL Take 500 mg by mouth 3 (three) times daily.   buPROPion 150 MG 24 hr tablet Commonly known as:  WELLBUTRIN XL Take 150 mg by mouth daily.   carvedilol 6.25 MG tablet Commonly known as:  COREG Take 1 tablet (6.25 mg total) by mouth 2 (two) times daily with a meal.   cholecalciferol 1000 units tablet Commonly known as:  VITAMIN D Take 1,000 Units by mouth daily.   furosemide 40 MG tablet Commonly known as:  LASIX Take 1 tablet (40 mg total) by mouth 2 (two) times daily.   gabapentin 400 MG capsule Commonly known as:  NEURONTIN Take 400 mg by mouth every 8 (eight) hours.   lisinopril 5 MG tablet Commonly known as:  PRINIVIL,ZESTRIL Take 1 tablet (5 mg total) by mouth daily.   lurasidone  20 MG Tabs tablet Commonly known as:  LATUDA Take 20 mg by mouth daily.   metFORMIN 500 MG tablet Commonly known as:  GLUCOPHAGE Take 500 mg by mouth 2 (two) times daily with a meal.   sertraline 100 MG tablet Commonly known as:  ZOLOFT Take 200 mg by mouth daily.      Not on File Follow-up Ruben Stanley Follow up on 09/21/2015.     Specialty:  Cardiology Why:  at 10:40 am Linna Hoff office) Contact information: [address] [place] [address] [phone]            The results of significant diagnostics from this hospitalization (including imaging, microbiology, ancillary and laboratory) are listed below for reference.    Significant Diagnostic Studies: Dg Chest 2 View  Result Date: 08/30/2015 CLINICAL DATA:  Shortness of breath and hypoxia a, worse at night. Smoking history. EXAM: CHEST  2 VIEW COMPARISON:  None. FINDINGS: The heart is enlarged. There are bilateral pleural effusions layering dependently with dependent atelectasis. There is interstitial pulmonary edema and early alveolar edema. No acute bone finding. IMPRESSION: Congestive heart failure with pulmonary edema and bilateral pleural effusions. Electronically Signed   By: Nelson Chimes M.D.   On: 08/30/2015 11:43   Ct Angio Chest Pe W/cm &/or Wo Cm  Result Date: 08/30/2015 CLINICAL DATA:  Shortness of breath, worsening over the last 2 days. Recent diagnosis of liver cancer. EXAM: CT ANGIOGRAPHY CHEST WITH CONTRAST TECHNIQUE: Multidetector CT imaging of the chest was performed using the standard protocol during bolus administration of intravenous contrast. Multiplanar CT image reconstructions and MIPs were obtained to evaluate the vascular anatomy. CONTRAST:  100 cc Isovue 370 COMPARISON:  Chest radiography same day FINDINGS: Pulmonary arterial opacification is moderate.  No visible emboli. There are bilateral effusions layering dependently. There is dependent pulmonary atelectasis with considerable volume loss in both lower lobes. There is interstitial prominence suggesting edema. No mediastinal mass or lymphadenopathy. Aortic atherosclerosis, mild. No visible coronary artery calcification. Scans in the upper abdomen show cirrhosis of the liver. No definable focal lesion using this technique. The patient has a few calcified gallstones. Review of the MIP  images confirms the above findings. IMPRESSION: Most consistent with congestive heart failure. Bilateral effusions with dependent atelectasis. Interstitial edema. No pulmonary emboli. Cirrhosis of the liver.  Few gallstones. Electronically Signed   By: Nelson Chimes M.D.   On: 08/30/2015 12:47    Microbiology: No results found for this or any previous visit (from the past 240 hour(s)).   Labs: Basic Metabolic Panel:  Recent Labs Lab 08/31/15 0545 09/01/15 0515 09/02/15 0655 09/03/15 0358 09/04/15 0502 09/05/15 1024  NA 139 142 141 143 141 136  K 3.1* 3.1* 3.1* 3.4* 3.2* 3.7  CL 109 106 104 104 104 102  CO2 28 31 30 30 31 30   GLUCOSE 114* 116* 110* 109* 103* 206*  BUN 17 19 15 15 16 15   CREATININE 1.18 1.21 1.16 1.24 1.26* 1.36*  CALCIUM 8.1* 8.3* 8.6* 8.8* 8.8* 8.4*  MG 2.1  --   --   --   --   --    Liver Function Tests:  Recent Labs Lab 08/30/15 1019 08/31/15 0545  AST 44* 39  ALT 30 26  ALKPHOS 156* 149*  BILITOT 1.0 0.7  PROT 8.0 7.5  ALBUMIN 3.4* 3.2*   No results for input(s): LIPASE, AMYLASE in the last 168 hours. No results for input(s): AMMONIA in the last 168  hours. CBC:  Recent Labs Lab 08/30/15 1019 08/31/15 0545 09/01/15 0515 09/03/15 0358 09/04/15 0502  WBC 7.8 10.4 9.5 7.2 6.7  HGB 7.3* 7.0* 7.5* 7.3* 7.3*  HCT 26.7* 25.8* 27.2* 27.1* 26.6*  MCV 72.2* 72.5* 74.9* 74.5* 73.9*  PLT 235 245 233 214 223   Cardiac Enzymes:  Recent Labs Lab 08/30/15 1019 08/30/15 1355  TROPONINI 0.03* 0.03*   BNP: BNP (last 3 results)  Recent Labs  08/30/15 1019  BNP 946.0*    ProBNP (last 3 results) No results for input(s): PROBNP in the last 8760 hours.  CBG:  Recent Labs Lab 09/04/15 0824 09/04/15 1141 09/04/15 1540 09/04/15 2023 09/05/15 0745  GLUCAP 135* 103* 115* 124* 104*       Signed:  HERNANDEZ ACOSTA,Blaize Nipper  [facility] Pager: [phone] 09/05/2015, 11:47 AM

## 2015-09-05 NOTE — Progress Notes (Signed)
Pt a/o.vss. Saline lock removed. Up ad lib. Discharge instructions given. Prescriptions given. Pt spouse verbalized understanding of instructions. Pt left floor via wheelchair with spouse and nursing staff.

## 2015-09-05 NOTE — Progress Notes (Addendum)
Patient up sitting on side of the bed. oxygen sats are 94%. Patient ambulated from room 331 to nurses station and oxygen saturations on room air dropped to 82%. Oxygen reapplied while ambulating and saturations came up to 92%. Patient assisted back to bed.

## 2015-09-05 NOTE — Care Management Important Message (Signed)
Important Message  Patient Details  Name: Ruben Stanley MRN: [id number] Date of Birth: 07-26-1955   Medicare Important Message Given:  Yes    [name], [name], RN 09/05/2015, 9:44 AM

## 2015-09-05 NOTE — Progress Notes (Addendum)
Subjective:  No cardiac complaints. Breathing better  Objective:  Vital Signs in the last 24 hours: Temp:  [98.6 F (37 C)-98.8 F (37.1 C)] 98.6 F (37 C) (07/26 0500) Pulse Rate:  [90-115] 100 (07/26 0500) Resp:  [20-22] 22 (07/26 0500) BP: (122-128)/(78-88) 128/78 (07/26 0500) SpO2:  [84 %-99 %] 97 % (07/26 0500) Weight:  [253 lb (114.8 kg)] 253 lb (114.8 kg) (07/26 0500)  Intake/Output from previous day: 07/25 0701 - 07/26 0700 In: -  Out: 700 [Urine:700] Intake/Output from this shift: No intake/output data recorded.  Physical Exam: NECK: Without JVD, HJR, or bruit LUNGS: Decreased breath sounds with fine crackles at bases HEART: Regular rate and rhythm, no murmur, gallop, rub, bruit, thrill, or heave EXTREMITIES: Without cyanosis, clubbing, or edema   Lab Results:  Recent Labs  09/03/15 0358 09/04/15 0502  WBC 7.2 6.7  HGB 7.3* 7.3*  PLT 214 223    Recent Labs  09/03/15 0358 09/04/15 0502  NA 143 141  K 3.4* 3.2*  CL 104 104  CO2 30 31  GLUCOSE 109* 103*  BUN 15 16  CREATININE 1.24 1.26*   No results for input(s): TROPONINI in the last 72 hours.  Invalid input(s): CK, MB Hepatic Function Panel No results for input(s): PROT, ALBUMIN, AST, ALT, ALKPHOS, BILITOT, BILIDIR, IBILI in the last 72 hours. No results for input(s): CHOL in the last 72 hours. No results for input(s): PROTIME in the last 72 hours.    Cardiac Studies: 2-D echo 08/31/15 Study Conclusions   - Left ventricle: The cavity size was mildly dilated. Wall   thickness was increased in a pattern of mild LVH. Systolic   function was moderately to severely reduced. The estimated   ejection fraction was in the range of 30% to 35%. Diffuse   hypokinesis. There is severe hypokinesis of the inferolateral   myocardium. Features are consistent with a pseudonormal left   ventricular filling pattern, with concomitant abnormal relaxation   and increased filling pressure (grade 2 diastolic  dysfunction). - Aortic valve: Mildly calcified annulus. Trileaflet. - Mitral valve: There was mild regurgitation. - Left atrium: The atrium was severely dilated. - Right atrium: Central venous pressure (est): 8 mm Hg. - Atrial septum: A patent foramen ovale cannot be excluded. - Tricuspid valve: There was trivial regurgitation. - Pulmonary arteries: Systolic pressure could not be accurately   estimated. - Pericardium, extracardiac: There was no pericardial effusion.   Impressions:   - Mild LVH with mild LV chamber dilatation and LVEF 30-35%. There   is diffuse hypokinesis, most significant in the inferolateral   wall. Grade 2 diastolic dysfunction with increased LV filling   pressure. No obvious LV mural thrombus with Definity contrast.   Severe left atrial enlargement. Mild mitral regurgitation.   Trivial tricuspid regurgitation. Cannot exclude PFO with small   degree of left to right shunting..    Assessment/Plan:  1.Acute on Chronic Mixed CHF: new for patient EF of 30-35%, with grade 2 diastolic dysfunction. He has diuresed 9.7 liters since admission weight down 18 lbs. He is on a fluid restriction of 1500 cc. He is symptomatically improved, with no  Evidence of overt fluid overload. Consider Entresto as an outpatient. Also need nuclear stress testing as an outpatient with inferolateral hypokinesis on echo. Follow-up echo in 3 months to assess for improvement in LVEF. May need to consider AICD but will need to be evaluated in the setting of liver cancer and overall prognosis. F/U with Dr. Bronson Ing as  outpatient.   2. Hypertension: BP is controlled currently. Will monitor with increased dose of coreg.   3.Liver cancer: new diagnosis with f/u at [facility]  4. Anemia: no ASA for now. Hbg 7.3  5. COPD     LOS: 6 days    Ermalinda Barrios 09/05/2015, 8:06 AM   The patient was seen and examined, and I agree with the physical exam, assessment and plan as documented above by Gerrianne Scale, with  modifications as noted below. Pt feeling well, ready to go home. I have transitioned him to oral Lasix. I have ordered a BMET for this morning. I will also arrange for outpatient follow up with me.  Kate Sable, MD, [facility]  09/05/2015 9:57 AM

## 2015-09-21 ENCOUNTER — Encounter: Payer: Self-pay | Admitting: Cardiovascular Disease

## 2015-09-21 ENCOUNTER — Ambulatory Visit (INDEPENDENT_AMBULATORY_CARE_PROVIDER_SITE_OTHER): Payer: Medicare Other | Admitting: Cardiovascular Disease

## 2015-09-21 VITALS — BP 134/78 | HR 89 | Ht 73.0 in | Wt 264.0 lb

## 2015-09-21 DIAGNOSIS — I5042 Chronic combined systolic (congestive) and diastolic (congestive) heart failure: Secondary | ICD-10-CM

## 2015-09-21 DIAGNOSIS — I429 Cardiomyopathy, unspecified: Secondary | ICD-10-CM

## 2015-09-21 DIAGNOSIS — Z9289 Personal history of other medical treatment: Secondary | ICD-10-CM

## 2015-09-21 DIAGNOSIS — Z87898 Personal history of other specified conditions: Secondary | ICD-10-CM

## 2015-09-21 DIAGNOSIS — I10 Essential (primary) hypertension: Secondary | ICD-10-CM | POA: Diagnosis not present

## 2015-09-21 MED ORDER — SACUBITRIL-VALSARTAN 24-26 MG PO TABS: 1.0000 | ORAL_TABLET | Freq: Two times a day (BID) | ORAL | 3 refills | Status: AC

## 2015-09-21 NOTE — Progress Notes (Signed)
SUBJECTIVE: The patient presents for follow-up for acute on chronic systolic heart failure, EF 30-35%, with grade 2 diastolic dysfunction. He was also recently diagnosed with liver cancer. Echocardiogram also showed severe hypokinesis of the inferolateral wall. There was severe left atrial dilatation.  He is doing well and denies chest pain, leg swelling, palpitations, and shortness of breath. He has not had to use oxygen.  Review of Systems: As per "subjective", otherwise negative.  Not on File  Current Outpatient Prescriptions  Medication Sig Dispense Refill  . acetaminophen (TYLENOL) 500 MG tablet Take 500 mg by mouth 3 (three) times daily.    Marland Kitchen buPROPion (WELLBUTRIN XL) 150 MG 24 hr tablet Take 150 mg by mouth daily.    . carvedilol (COREG) 6.25 MG tablet Take 1 tablet (6.25 mg total) by mouth 2 (two) times daily with a meal. 60 tablet 2  . cholecalciferol (VITAMIN D) 1000 units tablet Take 1,000 Units by mouth daily.    . furosemide (LASIX) 40 MG tablet Take 1 tablet (40 mg total) by mouth 2 (two) times daily. 60 tablet 2  . gabapentin (NEURONTIN) 400 MG capsule Take 400 mg by mouth every 8 (eight) hours.    Marland Kitchen lisinopril (PRINIVIL,ZESTRIL) 5 MG tablet Take 1 tablet (5 mg total) by mouth daily. 30 tablet 1  . lurasidone (LATUDA) 20 MG TABS tablet Take 20 mg by mouth daily.    . metFORMIN (GLUCOPHAGE) 500 MG tablet Take 500 mg by mouth 2 (two) times daily with a meal.    . sertraline (ZOLOFT) 100 MG tablet Take 200 mg by mouth daily.     No current facility-administered medications for this visit.     Past Medical History:  Diagnosis Date  . Cancer [facility])    liver cancer  . CHF (congestive heart failure) ([place])   . COPD (chronic obstructive pulmonary disease) ([place])   . Diabetes mellitus without complication ([place])   . High cholesterol   . Hypertension   . Neuropathy ([place])     No past surgical history on file.  Social History   Social History  . Marital status:  Married    Spouse name: N/A  . Number of children: N/A  . Years of education: N/A   Occupational History  . Not on file.   Social History Main Topics  . Smoking status: Current Every Day Smoker    Packs/day: 1.00    Start date: 09/20/1972  . Smokeless tobacco: Never Used  . Alcohol use No  . Drug use: No     Comment: clean 3 yrs crack,etoh  . Sexual activity: Not on file   Other Topics Concern  . Not on file   Social History Narrative  . No narrative on file     Vitals:   09/21/15 1100  BP: 134/78  Pulse: 89  SpO2: 93%  Weight: 264 lb (119.7 kg)  Height: 6\' 1"  (1.854 m)    PHYSICAL EXAM General: NAD HEENT: Normal. Neck: No JVD, no thyromegaly. Lungs: Clear to auscultation bilaterally with normal respiratory effort. CV: Nondisplaced PMI.  Regular rate and rhythm, normal S1/S2, no S3/S4, no murmur. No pretibial or periankle edema.  .   Abdomen: Soft, obese.  Neurologic: Alert and oriented.  Psych: Normal affect. Skin: Normal. Musculoskeletal: No gross deformities.    ECG: Most recent ECG reviewed.      ASSESSMENT AND PLAN: 1. Chronic combined systolic and diastolic heart failure, EF 30-35%: Euvolemic.Currently on carvedilol 6.25 mg twice  daily and lisinopril 5 mg daily. Taking Lasix 40 mg twice daily. Will stop lisinopril for 36 hr washout period and start Entresto 24/26 mg bid.  Check BMET next week. Will repeat echo in next few months to assess for LVEF interval improvement. Will obtain stress test to evaluate for ischemic etiology. With respect to needing an AICD, this will need to be evaluated in the context of his liver cancer and overall prognosis.  2. HTN: controlled. Monitor given introduction of Entresto.  Dispo: fu 3 months   Kate Sable, M.D., F.A.C.C.

## 2015-09-21 NOTE — Patient Instructions (Addendum)
Your physician recommends that you schedule a follow-up appointment in: 3 months   STOP Oxygen    Stop Lisinopril  TODAY   START Entresto 24/26 mg in 2 days after stopping lisinopril 09/24/15   Your physician has requested that you have a lexiscan myoview. For further information please visit [email or web address]. Please follow instruction sheet, as given. HOLD METFORMIN THE MORNING OF TEST     Get lab work next week :BMET        Thank you for choosing [facility] !

## 2015-09-28 ENCOUNTER — Telehealth: Payer: Self-pay | Admitting: Cardiovascular Disease

## 2015-09-28 NOTE — Telephone Encounter (Signed)
Unable to reach at number in chart.

## 2015-10-02 ENCOUNTER — Encounter (HOSPITAL_COMMUNITY): Payer: Medicare Other

## 2015-10-02 ENCOUNTER — Inpatient Hospital Stay (HOSPITAL_COMMUNITY): Admission: RE | Admit: 2015-10-02 | Payer: Medicare Other | Source: Ambulatory Visit

## 2016-01-01 ENCOUNTER — Ambulatory Visit: Payer: Medicare Other | Admitting: Cardiovascular Disease

## 2016-01-01 ENCOUNTER — Encounter: Payer: Self-pay | Admitting: Cardiovascular Disease

## 2016-05-09 DIAGNOSIS — R918 Other nonspecific abnormal finding of lung field: Secondary | ICD-10-CM | POA: Diagnosis not present

## 2016-05-09 DIAGNOSIS — M25561 Pain in right knee: Secondary | ICD-10-CM | POA: Diagnosis not present

## 2016-05-09 DIAGNOSIS — E039 Hypothyroidism, unspecified: Secondary | ICD-10-CM | POA: Diagnosis present

## 2016-05-09 DIAGNOSIS — J9690 Respiratory failure, unspecified, unspecified whether with hypoxia or hypercapnia: Secondary | ICD-10-CM | POA: Diagnosis not present

## 2016-05-09 DIAGNOSIS — I10 Essential (primary) hypertension: Secondary | ICD-10-CM | POA: Diagnosis not present

## 2016-05-09 DIAGNOSIS — J189 Pneumonia, unspecified organism: Secondary | ICD-10-CM | POA: Diagnosis not present

## 2016-05-09 DIAGNOSIS — J9620 Acute and chronic respiratory failure, unspecified whether with hypoxia or hypercapnia: Secondary | ICD-10-CM | POA: Diagnosis present

## 2016-05-09 DIAGNOSIS — J441 Chronic obstructive pulmonary disease with (acute) exacerbation: Secondary | ICD-10-CM | POA: Diagnosis not present

## 2016-05-09 DIAGNOSIS — M13861 Other specified arthritis, right knee: Secondary | ICD-10-CM | POA: Diagnosis not present

## 2016-05-09 DIAGNOSIS — R5381 Other malaise: Secondary | ICD-10-CM | POA: Diagnosis not present

## 2016-05-09 DIAGNOSIS — Z430 Encounter for attention to tracheostomy: Secondary | ICD-10-CM | POA: Diagnosis not present

## 2016-05-09 DIAGNOSIS — F05 Delirium due to known physiological condition: Secondary | ICD-10-CM | POA: Diagnosis not present

## 2016-05-09 DIAGNOSIS — I509 Heart failure, unspecified: Secondary | ICD-10-CM | POA: Diagnosis not present

## 2016-05-09 DIAGNOSIS — R1312 Dysphagia, oropharyngeal phase: Secondary | ICD-10-CM | POA: Diagnosis present

## 2016-05-09 DIAGNOSIS — Z431 Encounter for attention to gastrostomy: Secondary | ICD-10-CM | POA: Diagnosis not present

## 2016-05-09 DIAGNOSIS — I5021 Acute systolic (congestive) heart failure: Secondary | ICD-10-CM | POA: Diagnosis not present

## 2016-05-09 DIAGNOSIS — J9600 Acute respiratory failure, unspecified whether with hypoxia or hypercapnia: Secondary | ICD-10-CM | POA: Diagnosis not present

## 2016-05-09 DIAGNOSIS — R2689 Other abnormalities of gait and mobility: Secondary | ICD-10-CM | POA: Diagnosis not present

## 2016-05-09 DIAGNOSIS — F329 Major depressive disorder, single episode, unspecified: Secondary | ICD-10-CM | POA: Diagnosis not present

## 2016-05-09 DIAGNOSIS — M6281 Muscle weakness (generalized): Secondary | ICD-10-CM | POA: Diagnosis not present

## 2016-05-09 DIAGNOSIS — G4700 Insomnia, unspecified: Secondary | ICD-10-CM | POA: Diagnosis not present

## 2016-05-09 DIAGNOSIS — E1165 Type 2 diabetes mellitus with hyperglycemia: Secondary | ICD-10-CM | POA: Diagnosis not present

## 2016-05-09 DIAGNOSIS — R531 Weakness: Secondary | ICD-10-CM | POA: Diagnosis present

## 2016-05-09 DIAGNOSIS — M170 Bilateral primary osteoarthritis of knee: Secondary | ICD-10-CM | POA: Diagnosis not present

## 2016-05-09 DIAGNOSIS — B1920 Unspecified viral hepatitis C without hepatic coma: Secondary | ICD-10-CM | POA: Diagnosis present

## 2016-05-09 DIAGNOSIS — E1141 Type 2 diabetes mellitus with diabetic mononeuropathy: Secondary | ICD-10-CM | POA: Diagnosis not present

## 2016-05-09 DIAGNOSIS — Z9911 Dependence on respirator [ventilator] status: Secondary | ICD-10-CM | POA: Diagnosis not present

## 2016-05-09 DIAGNOSIS — F331 Major depressive disorder, recurrent, moderate: Secondary | ICD-10-CM | POA: Diagnosis not present

## 2016-05-09 DIAGNOSIS — F0631 Mood disorder due to known physiological condition with depressive features: Secondary | ICD-10-CM | POA: Diagnosis not present

## 2016-05-09 DIAGNOSIS — D649 Anemia, unspecified: Secondary | ICD-10-CM | POA: Diagnosis not present

## 2016-05-09 DIAGNOSIS — E1142 Type 2 diabetes mellitus with diabetic polyneuropathy: Secondary | ICD-10-CM | POA: Diagnosis present

## 2016-05-09 DIAGNOSIS — G7281 Critical illness myopathy: Secondary | ICD-10-CM | POA: Diagnosis present

## 2016-05-09 DIAGNOSIS — R410 Disorientation, unspecified: Secondary | ICD-10-CM | POA: Diagnosis not present

## 2016-05-09 DIAGNOSIS — R1311 Dysphagia, oral phase: Secondary | ICD-10-CM | POA: Diagnosis not present

## 2016-05-09 DIAGNOSIS — I5023 Acute on chronic systolic (congestive) heart failure: Secondary | ICD-10-CM | POA: Diagnosis not present

## 2016-05-09 DIAGNOSIS — D638 Anemia in other chronic diseases classified elsewhere: Secondary | ICD-10-CM | POA: Diagnosis not present

## 2016-05-09 DIAGNOSIS — B999 Unspecified infectious disease: Secondary | ICD-10-CM | POA: Diagnosis not present

## 2016-05-09 DIAGNOSIS — M238X2 Other internal derangements of left knee: Secondary | ICD-10-CM | POA: Diagnosis not present

## 2016-05-09 DIAGNOSIS — M238X1 Other internal derangements of right knee: Secondary | ICD-10-CM | POA: Diagnosis present

## 2016-05-09 DIAGNOSIS — J440 Chronic obstructive pulmonary disease with acute lower respiratory infection: Secondary | ICD-10-CM | POA: Diagnosis not present

## 2016-05-09 DIAGNOSIS — J181 Lobar pneumonia, unspecified organism: Secondary | ICD-10-CM | POA: Diagnosis not present

## 2016-05-09 DIAGNOSIS — J9621 Acute and chronic respiratory failure with hypoxia: Secondary | ICD-10-CM | POA: Diagnosis not present

## 2016-05-09 DIAGNOSIS — Z9981 Dependence on supplemental oxygen: Secondary | ICD-10-CM | POA: Diagnosis not present

## 2016-05-09 DIAGNOSIS — J988 Other specified respiratory disorders: Secondary | ICD-10-CM | POA: Diagnosis not present

## 2016-05-09 DIAGNOSIS — F419 Anxiety disorder, unspecified: Secondary | ICD-10-CM | POA: Diagnosis not present

## 2016-05-09 DIAGNOSIS — I110 Hypertensive heart disease with heart failure: Secondary | ICD-10-CM | POA: Diagnosis not present

## 2016-05-09 DIAGNOSIS — E119 Type 2 diabetes mellitus without complications: Secondary | ICD-10-CM | POA: Diagnosis not present

## 2016-05-09 DIAGNOSIS — F17210 Nicotine dependence, cigarettes, uncomplicated: Secondary | ICD-10-CM | POA: Diagnosis not present

## 2016-05-09 DIAGNOSIS — F1411 Cocaine abuse, in remission: Secondary | ICD-10-CM | POA: Diagnosis not present

## 2016-05-09 DIAGNOSIS — M13862 Other specified arthritis, left knee: Secondary | ICD-10-CM | POA: Diagnosis not present

## 2016-05-09 DIAGNOSIS — M25562 Pain in left knee: Secondary | ICD-10-CM | POA: Diagnosis not present

## 2016-05-29 DIAGNOSIS — B1920 Unspecified viral hepatitis C without hepatic coma: Secondary | ICD-10-CM | POA: Diagnosis not present

## 2016-05-29 DIAGNOSIS — E1141 Type 2 diabetes mellitus with diabetic mononeuropathy: Secondary | ICD-10-CM | POA: Diagnosis not present

## 2016-05-29 DIAGNOSIS — R2689 Other abnormalities of gait and mobility: Secondary | ICD-10-CM | POA: Diagnosis not present

## 2016-05-29 DIAGNOSIS — M6281 Muscle weakness (generalized): Secondary | ICD-10-CM | POA: Diagnosis not present

## 2016-05-29 DIAGNOSIS — B999 Unspecified infectious disease: Secondary | ICD-10-CM | POA: Diagnosis not present

## 2016-05-29 DIAGNOSIS — I110 Hypertensive heart disease with heart failure: Secondary | ICD-10-CM | POA: Diagnosis not present

## 2016-05-29 DIAGNOSIS — F329 Major depressive disorder, single episode, unspecified: Secondary | ICD-10-CM | POA: Diagnosis not present

## 2016-05-29 DIAGNOSIS — G4700 Insomnia, unspecified: Secondary | ICD-10-CM | POA: Diagnosis not present

## 2016-05-29 DIAGNOSIS — J441 Chronic obstructive pulmonary disease with (acute) exacerbation: Secondary | ICD-10-CM | POA: Diagnosis not present

## 2016-05-29 DIAGNOSIS — J9600 Acute respiratory failure, unspecified whether with hypoxia or hypercapnia: Secondary | ICD-10-CM | POA: Diagnosis not present

## 2016-05-29 DIAGNOSIS — J9620 Acute and chronic respiratory failure, unspecified whether with hypoxia or hypercapnia: Secondary | ICD-10-CM | POA: Diagnosis not present

## 2016-05-29 DIAGNOSIS — J189 Pneumonia, unspecified organism: Secondary | ICD-10-CM | POA: Diagnosis not present

## 2016-05-29 DIAGNOSIS — E039 Hypothyroidism, unspecified: Secondary | ICD-10-CM | POA: Diagnosis not present

## 2016-05-29 DIAGNOSIS — E1142 Type 2 diabetes mellitus with diabetic polyneuropathy: Secondary | ICD-10-CM | POA: Diagnosis not present

## 2016-05-29 DIAGNOSIS — I5023 Acute on chronic systolic (congestive) heart failure: Secondary | ICD-10-CM | POA: Diagnosis not present

## 2016-05-29 DIAGNOSIS — F418 Other specified anxiety disorders: Secondary | ICD-10-CM | POA: Diagnosis not present

## 2016-05-29 DIAGNOSIS — J988 Other specified respiratory disorders: Secondary | ICD-10-CM | POA: Diagnosis not present

## 2016-05-29 DIAGNOSIS — I10 Essential (primary) hypertension: Secondary | ICD-10-CM | POA: Diagnosis not present

## 2016-05-29 DIAGNOSIS — D649 Anemia, unspecified: Secondary | ICD-10-CM | POA: Diagnosis not present

## 2016-05-29 DIAGNOSIS — I5021 Acute systolic (congestive) heart failure: Secondary | ICD-10-CM | POA: Diagnosis not present

## 2016-05-29 DIAGNOSIS — J440 Chronic obstructive pulmonary disease with acute lower respiratory infection: Secondary | ICD-10-CM | POA: Diagnosis not present

## 2016-05-29 DIAGNOSIS — E441 Mild protein-calorie malnutrition: Secondary | ICD-10-CM | POA: Diagnosis not present

## 2016-05-29 DIAGNOSIS — R1311 Dysphagia, oral phase: Secondary | ICD-10-CM | POA: Diagnosis not present

## 2016-05-29 DIAGNOSIS — E038 Other specified hypothyroidism: Secondary | ICD-10-CM | POA: Diagnosis not present

## 2016-06-12 DIAGNOSIS — E441 Mild protein-calorie malnutrition: Secondary | ICD-10-CM | POA: Diagnosis not present

## 2016-06-12 DIAGNOSIS — F418 Other specified anxiety disorders: Secondary | ICD-10-CM | POA: Diagnosis not present

## 2016-06-12 DIAGNOSIS — J441 Chronic obstructive pulmonary disease with (acute) exacerbation: Secondary | ICD-10-CM | POA: Diagnosis not present

## 2016-06-12 DIAGNOSIS — I10 Essential (primary) hypertension: Secondary | ICD-10-CM | POA: Diagnosis not present

## 2016-06-12 DIAGNOSIS — E1141 Type 2 diabetes mellitus with diabetic mononeuropathy: Secondary | ICD-10-CM | POA: Diagnosis not present

## 2016-06-12 DIAGNOSIS — E038 Other specified hypothyroidism: Secondary | ICD-10-CM | POA: Diagnosis not present

## 2016-08-20 ENCOUNTER — Emergency Department (HOSPITAL_COMMUNITY)
Admission: EM | Admit: 2016-08-20 | Discharge: 2016-08-20 | Disposition: A | Discharge status: Home / Self Care | Payer: Medicare Other | Attending: Emergency Medicine | Admitting: Emergency Medicine

## 2016-08-20 ENCOUNTER — Emergency Department (HOSPITAL_COMMUNITY): Payer: Medicare Other

## 2016-08-20 ENCOUNTER — Encounter (HOSPITAL_COMMUNITY): Payer: Self-pay | Admitting: Emergency Medicine

## 2016-08-20 DIAGNOSIS — R05 Cough: Secondary | ICD-10-CM | POA: Diagnosis not present

## 2016-08-20 DIAGNOSIS — I110 Hypertensive heart disease with heart failure: Secondary | ICD-10-CM | POA: Insufficient documentation

## 2016-08-20 DIAGNOSIS — E119 Type 2 diabetes mellitus without complications: Secondary | ICD-10-CM | POA: Diagnosis not present

## 2016-08-20 DIAGNOSIS — J449 Chronic obstructive pulmonary disease, unspecified: Secondary | ICD-10-CM | POA: Insufficient documentation

## 2016-08-20 DIAGNOSIS — Z79899 Other long term (current) drug therapy: Secondary | ICD-10-CM | POA: Diagnosis not present

## 2016-08-20 DIAGNOSIS — I509 Heart failure, unspecified: Secondary | ICD-10-CM | POA: Insufficient documentation

## 2016-08-20 DIAGNOSIS — Z8505 Personal history of malignant neoplasm of liver: Secondary | ICD-10-CM | POA: Diagnosis not present

## 2016-08-20 DIAGNOSIS — J189 Pneumonia, unspecified organism: Secondary | ICD-10-CM | POA: Insufficient documentation

## 2016-08-20 DIAGNOSIS — F17210 Nicotine dependence, cigarettes, uncomplicated: Secondary | ICD-10-CM | POA: Insufficient documentation

## 2016-08-20 DIAGNOSIS — R0600 Dyspnea, unspecified: Secondary | ICD-10-CM | POA: Diagnosis present

## 2016-08-20 MED ORDER — AZITHROMYCIN 250 MG PO TABS: 250.0000 mg | ORAL_TABLET | Freq: Every day | ORAL | 0 refills | Status: AC

## 2016-08-20 MED ORDER — AZITHROMYCIN 250 MG PO TABS
500.0000 mg | ORAL_TABLET | Freq: Once | ORAL | Status: AC
  Administered 2016-08-20: 500 mg via ORAL
  Filled 2016-08-20: qty 2

## 2016-08-20 MED ORDER — AMOXICILLIN 500 MG PO CAPS: 1000.0000 mg | ORAL_CAPSULE | Freq: Two times a day (BID) | ORAL | 0 refills | Status: AC

## 2016-08-20 MED ORDER — AMOXICILLIN 250 MG PO CAPS
1000.0000 mg | ORAL_CAPSULE | Freq: Once | ORAL | Status: AC
  Administered 2016-08-20: 1000 mg via ORAL
  Filled 2016-08-20: qty 4

## 2016-08-20 NOTE — ED Provider Notes (Signed)
[facility] DEPT Provider Note   CSN: [id number] Arrival date & time: 08/20/16  1916  By signing my name below, I, Ruben Stanley, attest that this documentation has been prepared under the direction and in the presence of physician practitioner, Virgel Manifold, MD. Electronically Signed: Dora Stanley, Scribe. 08/20/2016. 8:05 PM.  History   Chief Complaint Chief Complaint  Patient presents with  . Wound Check   The history is provided by the patient. No language interpreter was used.    HPI Comments: Ruben Stanley is a 61 y.o. male who presents to the Emergency Department complaining of drainage and secretions from his tracheostomy site ongoing for one week. Patient had a tracheostomy tube placed last February while he was in a coma. The tube was removed last May. For the last week, he is reporting some pain secondary to the site, mild dyspnea, and a cough productive of sputum in addition to the drainage. He endorses pain exacerbation with swallowing. Patient does note that the tracheostomy site has been healing well. He denies fevers, chills, chest pain, back pain, or any other associated symptoms.  Past Medical History:  Diagnosis Date  . Cancer [facility])    liver cancer  . CHF (congestive heart failure) ([place])   . COPD (chronic obstructive pulmonary disease) ([place])   . Diabetes mellitus without complication ([place])   . High cholesterol   . Hypertension   . Neuropathy     Patient Active Problem List   Diagnosis Date Noted  . Hypokalemia 09/01/2015  . CHF (congestive heart failure) ([place]) 08/30/2015  . COPD (chronic obstructive pulmonary disease) ([place]) 08/30/2015  . DM (diabetes mellitus) ([place]) 08/30/2015  . Essential hypertension 08/30/2015  . Liver cancer ([place]) 08/30/2015  . HLD (hyperlipidemia) 08/30/2015  . Acute congestive heart failure ([place]) 08/30/2015    History reviewed. No pertinent surgical history.     Home Medications    Prior to Admission medications     Medication Sig Start Date End Date Taking? Authorizing Provider  acetaminophen (TYLENOL) 500 MG tablet Take 500 mg by mouth 3 (three) times daily.    [provider]  buPROPion (WELLBUTRIN XL) 150 MG 24 hr tablet Take 150 mg by mouth daily.    [provider]  carvedilol (COREG) 6.25 MG tablet Take 1 tablet (6.25 mg total) by mouth 2 (two) times daily with a meal. 09/05/15   Isaac Bliss, Rayford Halsted, MD  cholecalciferol (VITAMIN D) 1000 units tablet Take 1,000 Units by mouth daily.    [provider]  furosemide (LASIX) 40 MG tablet Take 1 tablet (40 mg total) by mouth 2 (two) times daily. 09/05/15   Isaac Bliss, Rayford Halsted, MD  gabapentin (NEURONTIN) 400 MG capsule Take 400 mg by mouth every 8 (eight) hours.    [provider]  lurasidone (LATUDA) 20 MG TABS tablet Take 20 mg by mouth daily.    [provider]  metFORMIN (GLUCOPHAGE) 500 MG tablet Take 500 mg by mouth 2 (two) times daily with a meal.    [provider]  sacubitril-valsartan (ENTRESTO) 24-26 MG Take 1 tablet by mouth 2 (two) times daily. 09/21/15   Herminio Commons, MD  sertraline (ZOLOFT) 100 MG tablet Take 200 mg by mouth daily.    [provider]    Family History No family history on file.  Social History Social History  Substance Use Topics  . Smoking status: Current Every Day Smoker    Packs/day: 1.00    Start date: 09/20/1972  .  Smokeless tobacco: Never Used  . Alcohol use No     Allergies   Patient has no allergy information on record.   Review of Systems Review of Systems  All other systems reviewed and are negative.  Physical Exam Updated Vital Signs BP (!) 170/90 (BP Location: Right Arm)   Pulse (!) 106   Temp 98.8 F (37.1 C) (Oral)   Resp 20   Ht 6\' 1"  (1.854 m)   Wt 235 lb (106.6 kg)   SpO2 98%   BMI 31.00 kg/m   Physical Exam  Constitutional: He appears well-developed and well-nourished.  HENT:  Head: Normocephalic.   Right Ear: External ear normal.  Left Ear: External ear normal.  Nose: Nose normal.  Tracheostomy site appears to be healing well. Small amount of granulation tissue. Thick, tan secretions on the bandage, but once cleaned and with coughing no additional secretions are noted. Oropharynx is clear.  Eyes: Conjunctivae are normal. Right eye exhibits no discharge. Left eye exhibits no discharge.  Neck: Normal range of motion.  Cardiovascular: Normal rate, regular rhythm and normal heart sounds.   No murmur heard. Pulmonary/Chest: Effort normal and breath sounds normal. No respiratory distress. He has no wheezes. He has no rales.  Abdominal: Soft. There is no tenderness. There is no rebound and no guarding.  Musculoskeletal: Normal range of motion. He exhibits no edema or tenderness.  Neurological: He is alert. No cranial nerve deficit. Coordination normal.  Skin: Skin is warm and dry. No rash noted. No erythema. No pallor.  Psychiatric: He has a normal mood and affect. His behavior is normal.  Nursing note and vitals reviewed.  ED Treatments / Results  Labs (all labs ordered are listed, but only abnormal results are displayed) Labs Reviewed - No data to display  EKG  EKG Interpretation None       Radiology No results found.   Dg Chest 2 View  Result Date: 08/20/2016 CLINICAL DATA:  Cough with increased secretions from tracheostomy site. EXAM: CHEST  2 VIEW COMPARISON:  Radiographs and CT 08/30/2015 FINDINGS: Cardiomegaly and tortuous thoracic aorta. Cardiomegaly with slight decrease from prior exam. Interstitial opacities in the mid and lower lung zone. Trace right pleural effusion. No confluent consolidation. No pneumothorax. No acute osseous abnormalities. IMPRESSION: Cardiomegaly with interstitial opacities in the mid to lower lung zones, right greater than left. Suspect this is pulmonary edema and mild CHF, aspiration or multifocal pneumonia could have a similar appearance.  Electronically Signed   By: Jeb Levering M.D.   On: 08/20/2016 20:41    Procedures Procedures (including critical care time)  DIAGNOSTIC STUDIES: Oxygen Saturation is 98% on RA, normal by my interpretation.    COORDINATION OF CARE: 8:04 PM Discussed treatment plan with pt at bedside and pt agreed to plan.  Medications Ordered in ED Medications - No data to display   Initial Impression / Assessment and Plan / ED Course  I have reviewed the triage vital signs and the nursing notes.  Pertinent labs & imaging results that were available during my care of the patient were reviewed by me and considered in my medical decision making (see chart for details).     61yM with cough and increased secretions. CXR noted. Clinically euvolemic. Denies orthopnea. Clinically I'm not convinced this is HF. Aside from secretions I'm not sure pneumonia either, but will cover. He is nontoxic. WOB is not increased. O2 sats normal on RA. Course of abx for possible CAP.   Final Clinical Impressions(s) /  ED Diagnoses   Final diagnoses:  Community acquired pneumonia, unspecified laterality    New Prescriptions New Prescriptions   No medications on file   I personally preformed the services scribed in my presence. The recorded information has been reviewed is accurate. Virgel Manifold, MD.    Virgel Manifold, MD 08/30/16 [phone]

## 2016-08-20 NOTE — ED Triage Notes (Signed)
Pt c/o drainage coming from trach that choke him. Pt states this has been going on x2 months.

## 2016-08-22 DIAGNOSIS — E1165 Type 2 diabetes mellitus with hyperglycemia: Secondary | ICD-10-CM | POA: Diagnosis present

## 2016-08-22 DIAGNOSIS — Z79899 Other long term (current) drug therapy: Secondary | ICD-10-CM | POA: Diagnosis not present

## 2016-08-22 DIAGNOSIS — Z930 Tracheostomy status: Secondary | ICD-10-CM | POA: Diagnosis not present

## 2016-08-22 DIAGNOSIS — R410 Disorientation, unspecified: Secondary | ICD-10-CM | POA: Diagnosis present

## 2016-08-22 DIAGNOSIS — R918 Other nonspecific abnormal finding of lung field: Secondary | ICD-10-CM | POA: Diagnosis not present

## 2016-08-22 DIAGNOSIS — Z720 Tobacco use: Secondary | ICD-10-CM | POA: Diagnosis not present

## 2016-08-22 DIAGNOSIS — R061 Stridor: Secondary | ICD-10-CM | POA: Diagnosis not present

## 2016-08-22 DIAGNOSIS — B1920 Unspecified viral hepatitis C without hepatic coma: Secondary | ICD-10-CM | POA: Diagnosis present

## 2016-08-22 DIAGNOSIS — F17200 Nicotine dependence, unspecified, uncomplicated: Secondary | ICD-10-CM | POA: Diagnosis present

## 2016-08-22 DIAGNOSIS — E039 Hypothyroidism, unspecified: Secondary | ICD-10-CM | POA: Diagnosis present

## 2016-08-22 DIAGNOSIS — J440 Chronic obstructive pulmonary disease with acute lower respiratory infection: Secondary | ICD-10-CM | POA: Diagnosis not present

## 2016-08-22 DIAGNOSIS — J1520 Pneumonia due to staphylococcus, unspecified: Secondary | ICD-10-CM | POA: Diagnosis present

## 2016-08-22 DIAGNOSIS — J154 Pneumonia due to other streptococci: Secondary | ICD-10-CM | POA: Diagnosis not present

## 2016-08-22 DIAGNOSIS — E1142 Type 2 diabetes mellitus with diabetic polyneuropathy: Secondary | ICD-10-CM | POA: Diagnosis present

## 2016-08-22 DIAGNOSIS — J398 Other specified diseases of upper respiratory tract: Secondary | ICD-10-CM | POA: Diagnosis present

## 2016-08-22 DIAGNOSIS — E876 Hypokalemia: Secondary | ICD-10-CM | POA: Diagnosis present

## 2016-08-22 DIAGNOSIS — I110 Hypertensive heart disease with heart failure: Secondary | ICD-10-CM | POA: Diagnosis present

## 2016-08-22 DIAGNOSIS — I5022 Chronic systolic (congestive) heart failure: Secondary | ICD-10-CM | POA: Diagnosis not present

## 2016-08-22 DIAGNOSIS — J441 Chronic obstructive pulmonary disease with (acute) exacerbation: Secondary | ICD-10-CM | POA: Diagnosis not present

## 2016-08-22 DIAGNOSIS — Z8505 Personal history of malignant neoplasm of liver: Secondary | ICD-10-CM | POA: Diagnosis not present

## 2016-08-22 DIAGNOSIS — J17 Pneumonia in diseases classified elsewhere: Secondary | ICD-10-CM | POA: Diagnosis not present

## 2016-08-22 DIAGNOSIS — M47892 Other spondylosis, cervical region: Secondary | ICD-10-CM | POA: Diagnosis present

## 2016-08-22 DIAGNOSIS — J439 Emphysema, unspecified: Secondary | ICD-10-CM | POA: Diagnosis not present

## 2016-08-22 DIAGNOSIS — Z7984 Long term (current) use of oral hypoglycemic drugs: Secondary | ICD-10-CM | POA: Diagnosis not present

## 2016-08-22 DIAGNOSIS — J189 Pneumonia, unspecified organism: Secondary | ICD-10-CM | POA: Diagnosis not present

## 2016-08-22 DIAGNOSIS — F329 Major depressive disorder, single episode, unspecified: Secondary | ICD-10-CM | POA: Diagnosis present

## 2016-08-22 DIAGNOSIS — D638 Anemia in other chronic diseases classified elsewhere: Secondary | ICD-10-CM | POA: Diagnosis present

## 2017-01-12 DIAGNOSIS — R404 Transient alteration of awareness: Secondary | ICD-10-CM | POA: Diagnosis not present

## 2017-01-12 DIAGNOSIS — R531 Weakness: Secondary | ICD-10-CM | POA: Diagnosis not present

## 2017-09-17 DIAGNOSIS — I10 Essential (primary) hypertension: Secondary | ICD-10-CM | POA: Diagnosis not present

## 2017-09-17 DIAGNOSIS — D649 Anemia, unspecified: Secondary | ICD-10-CM | POA: Diagnosis not present

## 2017-09-17 DIAGNOSIS — R0602 Shortness of breath: Secondary | ICD-10-CM | POA: Diagnosis not present

## 2017-12-11 DIAGNOSIS — R0689 Other abnormalities of breathing: Secondary | ICD-10-CM | POA: Diagnosis not present

## 2017-12-11 DIAGNOSIS — R52 Pain, unspecified: Secondary | ICD-10-CM | POA: Diagnosis not present

## 2017-12-11 DIAGNOSIS — R0902 Hypoxemia: Secondary | ICD-10-CM | POA: Diagnosis not present

## 2017-12-11 DIAGNOSIS — R1084 Generalized abdominal pain: Secondary | ICD-10-CM | POA: Diagnosis not present

## 2017-12-11 DIAGNOSIS — R140 Abdominal distension (gaseous): Secondary | ICD-10-CM | POA: Diagnosis not present

## 2017-12-11 DIAGNOSIS — J9811 Atelectasis: Secondary | ICD-10-CM | POA: Diagnosis not present

## 2018-01-10 DEATH — deceased

## 2018-02-20 IMAGING — CT CT ANGIO CHEST
2 of 6 series · 19 of 46 positions shown · IV contrast (ISOVUE)
Comparison: Chest radiography same day

CLINICAL DATA: Shortness of breath, worsening over the last 2 days.
Recent diagnosis of liver cancer.

EXAM:
CT ANGIOGRAPHY CHEST WITH CONTRAST
TECHNIQUE: Multidetector CT imaging of the chest was performed using the
standard protocol during bolus administration of intravenous
contrast. Multiplanar CT image reconstructions and MIPs were
obtained to evaluate the vascular anatomy.
CONTRAST:  100 cc Isovue 370

[Series 6: pe thins 1.0 · axial · 0.78mm/px · z∈[-266,+44]mm · 16 of 346 slices shown]
[im 18/346  lung]
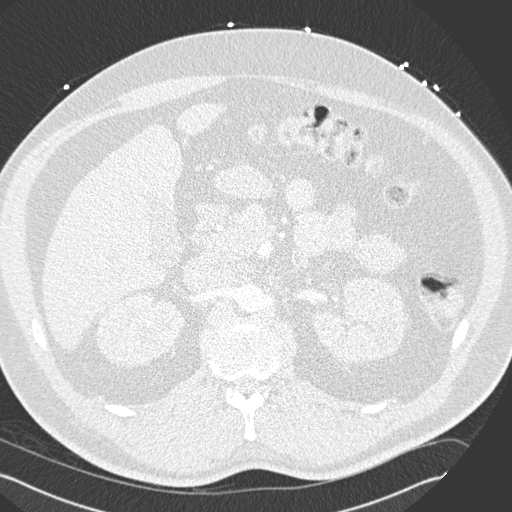
[im 35/346  soft-tissue]
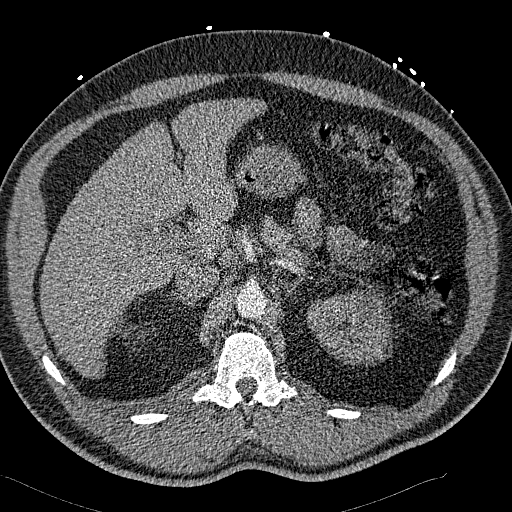
[im 52/346  lung]
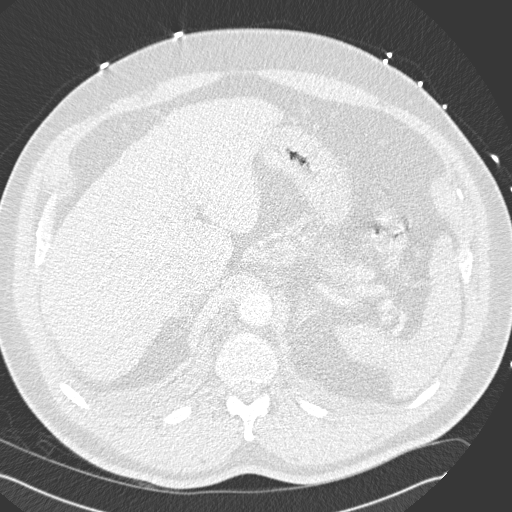
[im 87/346  soft-tissue]
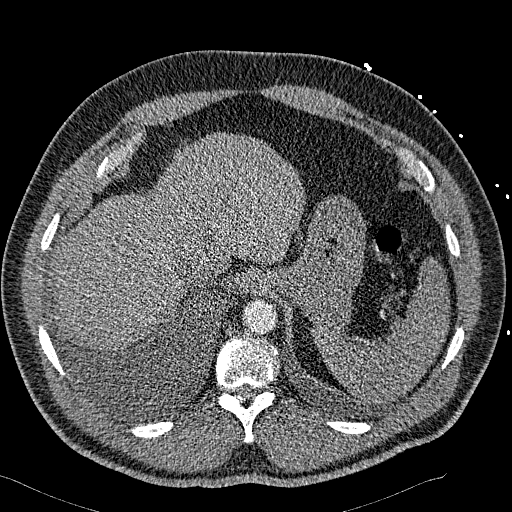
[im 104/346  lung]
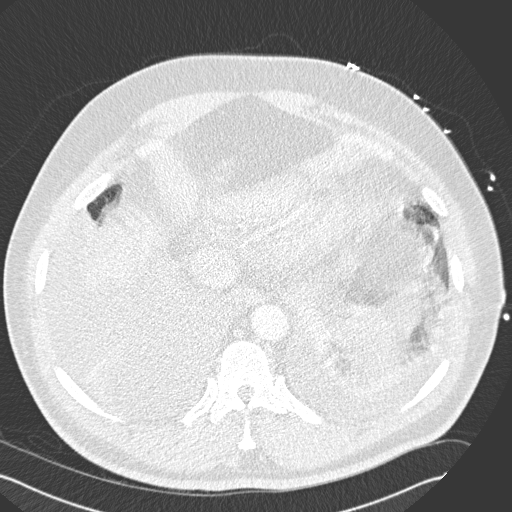
[im 121/346  soft-tissue]
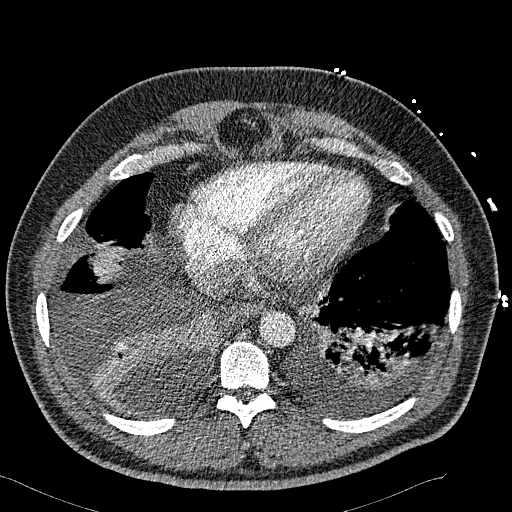
[im 139/346  lung]
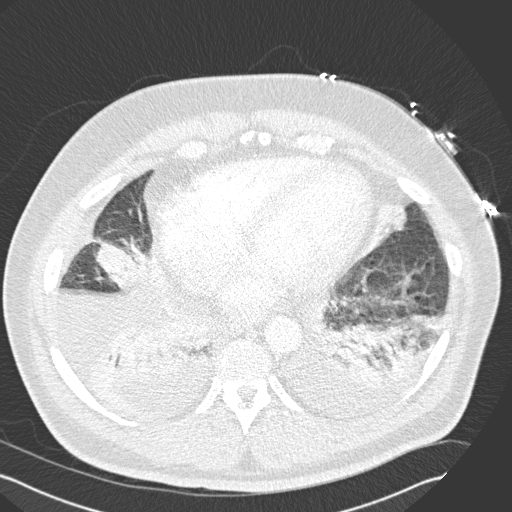
[im 156/346  soft-tissue]
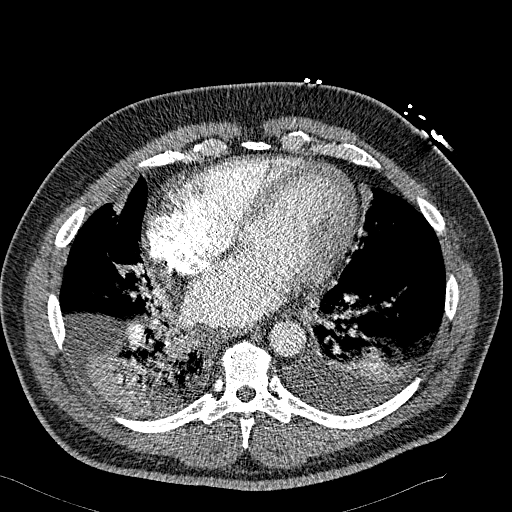
[im 190/346  lung]
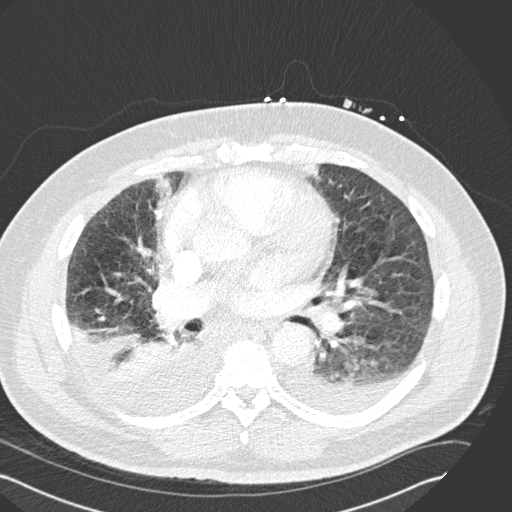
[im 208/346  soft-tissue]
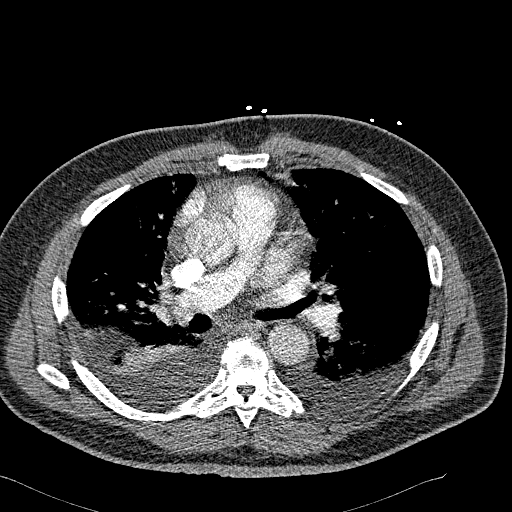
[im 225/346  lung]
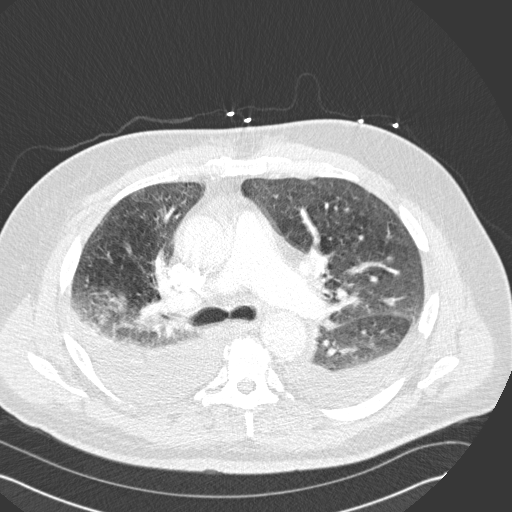
[im 242/346  soft-tissue]
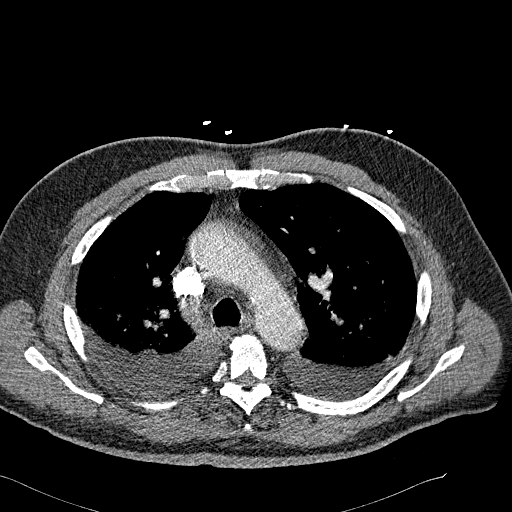
[im 259/346  lung]
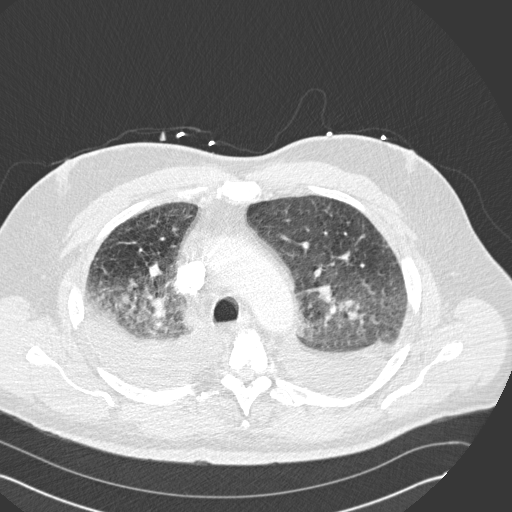
[im 294/346  soft-tissue]
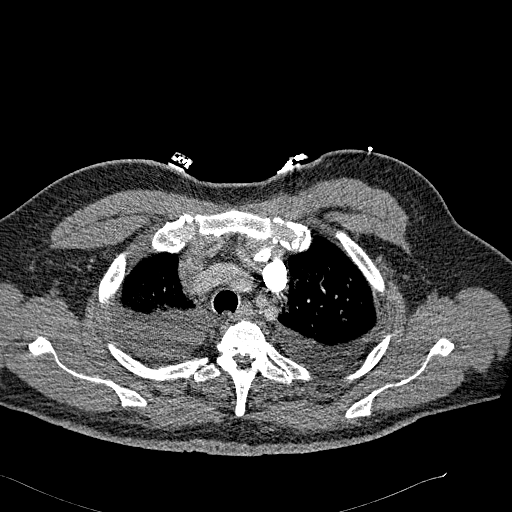
[im 311/346  lung]
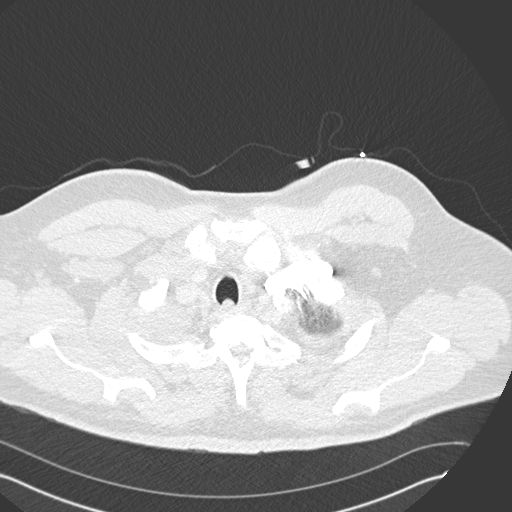
[im 328/346  soft-tissue]
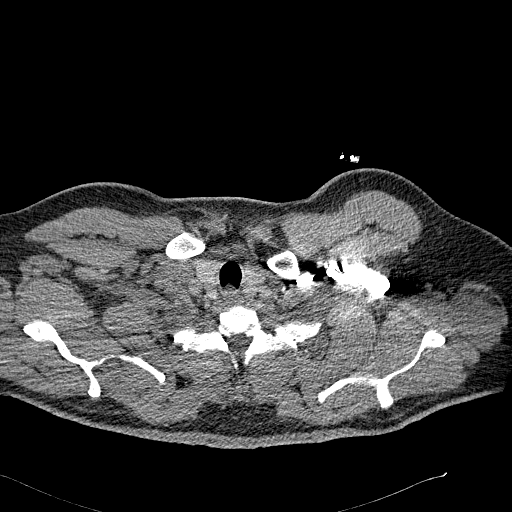

[Series 8: cor mpr 2.0 · coronal · 0.70mm/px · 3 of 187 slices shown]
[im 47/187  soft-tissue]
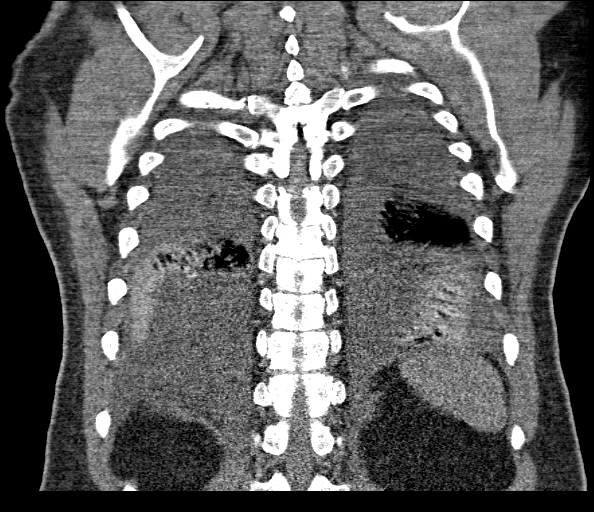
[im 94/187  soft-tissue]
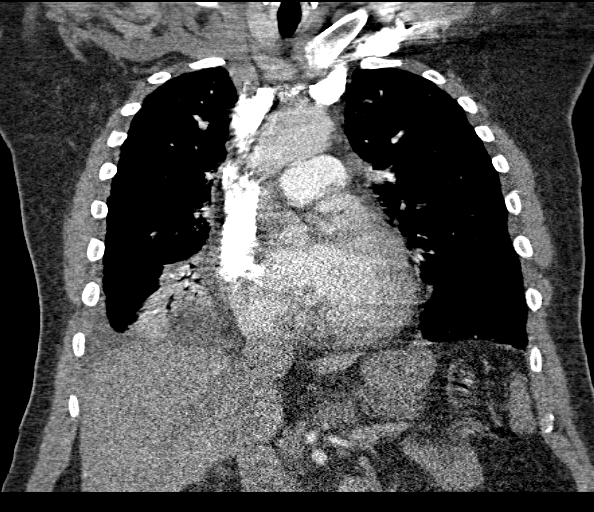
[im 140/187  soft-tissue]
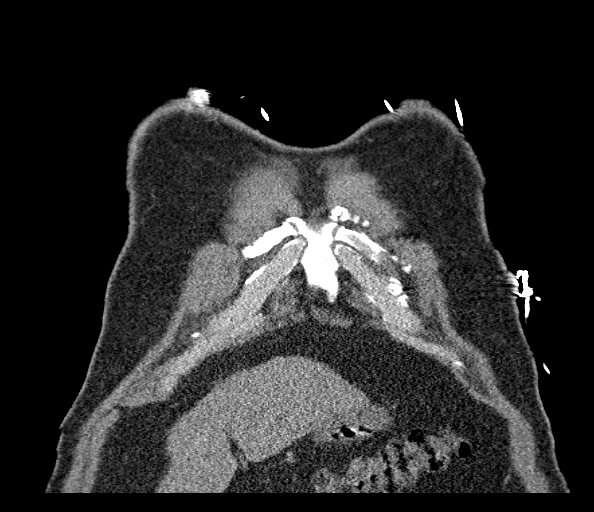

[19 of 46 positions shown; findings below may reference images not displayed]

FINDINGS: Pulmonary arterial opacification is moderate.  No visible emboli.

There are bilateral effusions layering dependently. There is
dependent pulmonary atelectasis with considerable volume loss in
both lower lobes. There is interstitial prominence suggesting edema.
No mediastinal mass or lymphadenopathy. Aortic atherosclerosis,
mild. No visible coronary artery calcification.

Scans in the upper abdomen show cirrhosis of the liver. No definable
focal lesion using this technique. The patient has a few calcified
gallstones.

Review of the MIP images confirms the above findings.
IMPRESSION: Most consistent with congestive heart failure. Bilateral effusions
with dependent atelectasis. Interstitial edema.

No pulmonary emboli.

Cirrhosis of the liver.  Few gallstones.
